# Patient Record
Sex: Male | Born: 1940 | Race: White | Hispanic: No | Marital: Married | Smoking: Never smoker
Health system: Southern US, Community
[De-identification: ages and names within clinical notes are randomized; demographics above are authoritative.]

## PROBLEM LIST (undated history)

## (undated) DIAGNOSIS — Z87442 Personal history of urinary calculi: Secondary | ICD-10-CM

## (undated) DIAGNOSIS — I2510 Atherosclerotic heart disease of native coronary artery without angina pectoris: Secondary | ICD-10-CM

## (undated) DIAGNOSIS — K5792 Diverticulitis of intestine, part unspecified, without perforation or abscess without bleeding: Secondary | ICD-10-CM

## (undated) DIAGNOSIS — I499 Cardiac arrhythmia, unspecified: Secondary | ICD-10-CM

## (undated) DIAGNOSIS — I4891 Unspecified atrial fibrillation: Secondary | ICD-10-CM

## (undated) DIAGNOSIS — E039 Hypothyroidism, unspecified: Secondary | ICD-10-CM

## (undated) DIAGNOSIS — E079 Disorder of thyroid, unspecified: Secondary | ICD-10-CM

## (undated) DIAGNOSIS — N200 Calculus of kidney: Secondary | ICD-10-CM

## (undated) DIAGNOSIS — I10 Essential (primary) hypertension: Secondary | ICD-10-CM

## (undated) DIAGNOSIS — M48061 Spinal stenosis, lumbar region without neurogenic claudication: Secondary | ICD-10-CM

## (undated) DIAGNOSIS — F32A Depression, unspecified: Secondary | ICD-10-CM

## (undated) DIAGNOSIS — F320 Major depressive disorder, single episode, mild: Secondary | ICD-10-CM

## (undated) DIAGNOSIS — D510 Vitamin B12 deficiency anemia due to intrinsic factor deficiency: Secondary | ICD-10-CM

## (undated) DIAGNOSIS — E785 Hyperlipidemia, unspecified: Secondary | ICD-10-CM

## (undated) DIAGNOSIS — M1990 Unspecified osteoarthritis, unspecified site: Secondary | ICD-10-CM

## (undated) DIAGNOSIS — K219 Gastro-esophageal reflux disease without esophagitis: Secondary | ICD-10-CM

## (undated) DIAGNOSIS — K635 Polyp of colon: Secondary | ICD-10-CM

## (undated) DIAGNOSIS — K589 Irritable bowel syndrome without diarrhea: Secondary | ICD-10-CM

## (undated) DIAGNOSIS — J189 Pneumonia, unspecified organism: Secondary | ICD-10-CM

## (undated) DIAGNOSIS — G4730 Sleep apnea, unspecified: Secondary | ICD-10-CM

## (undated) HISTORY — DX: Spinal stenosis, lumbar region without neurogenic claudication: M48.061

## (undated) HISTORY — PX: KNEE SURGERY: SHX244

## (undated) HISTORY — PX: VASECTOMY: SHX75

## (undated) HISTORY — PX: OTHER SURGICAL HISTORY: SHX169

## (undated) HISTORY — DX: Gastro-esophageal reflux disease without esophagitis: K21.9

## (undated) HISTORY — PX: HEMORRHOID SURGERY: SHX153

## (undated) HISTORY — DX: Vitamin B12 deficiency anemia due to intrinsic factor deficiency: D51.0

## (undated) HISTORY — PX: UMBILICAL HERNIA REPAIR: SHX196

## (undated) HISTORY — PX: LITHOTRIPSY: SUR834

## (undated) HISTORY — DX: Disorder of thyroid, unspecified: E07.9

## (undated) HISTORY — DX: Sleep apnea, unspecified: G47.30

## (undated) HISTORY — DX: Polyp of colon: K63.5

## (undated) HISTORY — DX: Atherosclerotic heart disease of native coronary artery without angina pectoris: I25.10

## (undated) HISTORY — PX: COLONOSCOPY: SHX174

## (undated) HISTORY — DX: Essential (primary) hypertension: I10

## (undated) HISTORY — DX: Depression, unspecified: F32.A

## (undated) HISTORY — DX: Major depressive disorder, single episode, mild: F32.0

## (undated) HISTORY — DX: Irritable bowel syndrome, unspecified: K58.9

## (undated) HISTORY — PX: CARPAL TUNNEL RELEASE: SHX101

## (undated) HISTORY — DX: Hyperlipidemia, unspecified: E78.5

---

## 1964-01-05 HISTORY — PX: HEMORRHOID SURGERY: SHX153

## 1978-01-04 HISTORY — PX: VASECTOMY: SHX75

## 1982-01-04 HISTORY — PX: CARPAL TUNNEL RELEASE: SHX101

## 1998-08-28 ENCOUNTER — Encounter: Payer: Self-pay | Admitting: Specialist

## 1998-08-28 ENCOUNTER — Ambulatory Visit (HOSPITAL_COMMUNITY): Admission: RE | Admit: 1998-08-28 | Discharge: 1998-08-28 | Payer: Self-pay | Admitting: Specialist

## 1998-11-29 ENCOUNTER — Encounter: Payer: Self-pay | Admitting: Emergency Medicine

## 1998-11-29 ENCOUNTER — Emergency Department (HOSPITAL_COMMUNITY): Admission: EM | Admit: 1998-11-29 | Discharge: 1998-11-29 | Payer: Self-pay | Admitting: Emergency Medicine

## 1998-12-01 ENCOUNTER — Observation Stay (HOSPITAL_COMMUNITY): Admission: EM | Admit: 1998-12-01 | Discharge: 1998-12-02 | Payer: Self-pay

## 1998-12-01 ENCOUNTER — Encounter: Payer: Self-pay | Admitting: *Deleted

## 1998-12-01 ENCOUNTER — Encounter: Admission: RE | Admit: 1998-12-01 | Discharge: 1998-12-01 | Payer: Self-pay | Admitting: *Deleted

## 1998-12-03 ENCOUNTER — Encounter: Payer: Self-pay | Admitting: *Deleted

## 1998-12-04 ENCOUNTER — Ambulatory Visit (HOSPITAL_COMMUNITY): Admission: RE | Admit: 1998-12-04 | Discharge: 1998-12-04 | Payer: Self-pay | Admitting: *Deleted

## 1998-12-04 ENCOUNTER — Encounter: Payer: Self-pay | Admitting: *Deleted

## 1998-12-11 ENCOUNTER — Encounter: Admission: RE | Admit: 1998-12-11 | Discharge: 1998-12-11 | Payer: Self-pay | Admitting: *Deleted

## 1998-12-11 ENCOUNTER — Encounter: Payer: Self-pay | Admitting: *Deleted

## 1998-12-25 ENCOUNTER — Encounter: Admission: RE | Admit: 1998-12-25 | Discharge: 1998-12-25 | Payer: Self-pay | Admitting: *Deleted

## 1998-12-25 ENCOUNTER — Encounter: Payer: Self-pay | Admitting: *Deleted

## 1999-01-01 ENCOUNTER — Ambulatory Visit (HOSPITAL_COMMUNITY): Admission: RE | Admit: 1999-01-01 | Discharge: 1999-01-01 | Payer: Self-pay | Admitting: *Deleted

## 1999-01-01 ENCOUNTER — Encounter: Payer: Self-pay | Admitting: *Deleted

## 1999-01-22 ENCOUNTER — Encounter: Admission: RE | Admit: 1999-01-22 | Discharge: 1999-01-22 | Payer: Self-pay | Admitting: Pediatrics

## 1999-01-22 ENCOUNTER — Encounter: Payer: Self-pay | Admitting: *Deleted

## 2003-01-05 HISTORY — PX: KNEE SURGERY: SHX244

## 2004-12-14 ENCOUNTER — Inpatient Hospital Stay (HOSPITAL_COMMUNITY): Admission: RE | Admit: 2004-12-14 | Discharge: 2004-12-15 | Payer: Self-pay | Admitting: Orthopedic Surgery

## 2007-01-05 HISTORY — PX: OTHER SURGICAL HISTORY: SHX169

## 2009-10-03 ENCOUNTER — Ambulatory Visit: Payer: Self-pay | Admitting: Cardiovascular Disease

## 2009-10-07 ENCOUNTER — Telehealth: Payer: Self-pay | Admitting: Cardiovascular Disease

## 2009-10-10 ENCOUNTER — Ambulatory Visit: Payer: Self-pay | Admitting: Cardiovascular Disease

## 2009-10-15 ENCOUNTER — Ambulatory Visit: Payer: Self-pay | Admitting: Cardiovascular Disease

## 2009-10-15 ENCOUNTER — Ambulatory Visit (HOSPITAL_COMMUNITY): Admission: RE | Admit: 2009-10-15 | Discharge: 2009-10-15 | Payer: Self-pay | Admitting: Cardiology

## 2009-10-15 ENCOUNTER — Telehealth: Payer: Self-pay | Admitting: Cardiovascular Disease

## 2009-10-15 HISTORY — PX: CARDIAC CATHETERIZATION: SHX172

## 2009-10-24 ENCOUNTER — Ambulatory Visit: Payer: Self-pay | Admitting: Cardiovascular Disease

## 2010-02-03 NOTE — Progress Notes (Signed)
  Phone Note Outgoing Call   Call placed by: Dessie Coma  LPN,  October 07, 2009 10:53 AM Call placed to: Patient Summary of Call: [facility] - notified patient per Dr. Kirke Corin, cholesterol levels looked fine.

## 2010-02-03 NOTE — Progress Notes (Signed)
  Phone Note Outgoing Call   Caller: Dr. Kirke Corin Summary of Call: T.C. from Dr. 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   Call placed by: Dessie Coma  LPN,  October 15, 2009 3:59 PM Call placed to: Patient Summary of Call: LMVM-notified patient of appt. for Echo tomorrow 10/16/09 at 10:30am at [facility].  To arrive at [facility]. at 10am.

## 2010-03-19 LAB — CBC
HCT: 38.7 % — ABNORMAL LOW (ref 39.0–52.0)
Hemoglobin: 12.9 g/dL — ABNORMAL LOW (ref 13.0–17.0)
MCH: 30.3 pg (ref 26.0–34.0)
MCHC: 33.3 g/dL (ref 30.0–36.0)
MCV: 90.8 fL (ref 78.0–100.0)
Platelets: 149 10*3/uL — ABNORMAL LOW (ref 150–400)
RBC: 4.26 MIL/uL (ref 4.22–5.81)
RDW: 12.6 % (ref 11.5–15.5)
WBC: 7.1 10*3/uL (ref 4.0–10.5)

## 2010-03-19 LAB — BASIC METABOLIC PANEL
BUN: 7 mg/dL (ref 6–23)
CO2: 22 mEq/L (ref 19–32)
Calcium: 7.5 mg/dL — ABNORMAL LOW (ref 8.4–10.5)
Chloride: 114 mEq/L — ABNORMAL HIGH (ref 96–112)
Creatinine, Ser: 0.67 mg/dL (ref 0.4–1.5)
GFR calc Af Amer: >60 mL/min (ref >60)
GFR calc non Af Amer: >60 mL/min (ref >60)
Glucose, Bld: 86 mg/dL (ref 70–99)
Potassium: 3.2 mEq/L — ABNORMAL LOW (ref 3.5–5.1)
Sodium: 139 mEq/L (ref 135–145)

## 2010-03-19 LAB — PROTIME-INR: INR: 1.04 (ref 0.00–1.49)

## 2010-03-30 ENCOUNTER — Encounter: Payer: Self-pay | Admitting: Cardiovascular Disease

## 2010-04-09 ENCOUNTER — Ambulatory Visit (INDEPENDENT_AMBULATORY_CARE_PROVIDER_SITE_OTHER): Payer: Self-pay | Admitting: Cardiovascular Disease

## 2010-04-09 ENCOUNTER — Encounter: Payer: Self-pay | Admitting: Cardiovascular Disease

## 2010-04-09 DIAGNOSIS — Z01810 Encounter for preprocedural cardiovascular examination: Secondary | ICD-10-CM

## 2010-04-09 DIAGNOSIS — E785 Hyperlipidemia, unspecified: Secondary | ICD-10-CM

## 2010-04-09 DIAGNOSIS — I2510 Atherosclerotic heart disease of native coronary artery without angina pectoris: Secondary | ICD-10-CM

## 2010-04-09 DIAGNOSIS — I10 Essential (primary) hypertension: Secondary | ICD-10-CM

## 2010-04-09 HISTORY — DX: Encounter for preprocedural cardiovascular examination: Z01.810

## 2010-04-09 NOTE — Progress Notes (Signed)
HPI  Miguel Morgan is here for his 6 months follow up visit. He also needs preoperative cardiovascular evaluation prior to back surgery. He had no cardiac events since his last visit. He feels well from a cardiac standpoint. No chest pain, dyspnea or palpitations. He is having significant back pain that is limiting his ability to perform activites of  Daily living.   No Known Allergies   Current Outpatient Prescriptions on File Prior to Visit  Medication Sig Dispense Refill  . Ascorbic Acid (VITAMIN C) 500 MG tablet Take 500 mg by mouth daily.        Marland Kitchen aspirin 325 MG tablet Take 325 mg by mouth daily.        Marland Kitchen atenolol (TENORMIN) 100 MG tablet Take 50 mg by mouth daily.       . Cholecalciferol (VITAMIN D) 2000 UNITS tablet Take 2,000 Units by mouth daily.        Marland Kitchen diltiazem (CARDIZEM CD) 360 MG 24 hr capsule Take 360 mg by mouth daily.        . fish oil-omega-3 fatty acids 1000 MG capsule Take 1 g by mouth 2 (two) times daily.        Marland Kitchen glucosamine-chondroitin 500-400 MG tablet Take 3 tablets by mouth 2 (two) times daily.        Marland Kitchen levothyroxine (SYNTHROID) 150 MCG tablet Take 150 mcg by mouth daily.        Marland Kitchen losartan (COZAAR) 50 MG tablet Take 50 mg by mouth daily.        Marland Kitchen omeprazole (PRILOSEC) 40 MG capsule Take 40 mg by mouth daily.        . rosuvastatin (CRESTOR) 5 MG tablet Take 5 mg by mouth daily.           Past Medical History  Diagnosis Date  . Sleep apnea   . Thyroid disease     hypothyroidism  . GERD (gastroesophageal reflux disease)   . Coronary artery disease     nonobstructive CAD  . Hyperlipidemia   . Hypertension      Past Surgical History  Procedure Date  . Knee surgery     right  . Hemorrhoid surgery   . Carpal tunnel release   . Cataract surgery   . Cardiac catheterization 10/15/2009    only mild CAD. 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 Normal EF.      History reviewed. No pertinent family history.   History   Social  History  . Marital Status: Married    Spouse Name: N/A    Number of Children: N/A  . Years of Education: N/A   Occupational History  . Not on file.   Social History Main Topics  . Smoking status: Never Smoker   . Smokeless tobacco: Not on file  . Alcohol Use: No  . Drug Use: No  . Sexually Active:    Other Topics Concern  . Not on file   Social History Narrative  . No narrative on file     ROS Constitutional: Negative for fever, chills, diaphoresis, activity change, appetite change and fatigue.  HENT: Negative for hearing loss, nosebleeds, congestion, sore throat, facial swelling, drooling, trouble swallowing, neck pain, voice change, sinus pressure and tinnitus.  Eyes: Negative for photophobia, pain, discharge and visual disturbance.  Respiratory: Negative for apnea, cough, chest tightness, shortness of breath and wheezing.  Cardiovascular: Negative for chest pain, palpitations and leg swelling.  Gastrointestinal: Negative for nausea, vomiting, abdominal pain,  diarrhea, constipation, blood in stool and abdominal distention.  Genitourinary: Negative for dysuria, urgency, frequency, hematuria and decreased urine volume.  Musculoskeletal: Negative for myalgias, joint swelling. Skin: Negative for color change, pallor, rash and wound.  Neurological: Negative for dizziness, tremors, seizures, syncope, speech difficulty, weakness, light-headedness, numbness and headaches.  Psychiatric/Behavioral: Negative for suicidal ideas, hallucinations, behavioral problems and agitation. The patient is not nervous/anxious.     PHYSICAL EXAM   BP 139/80  Pulse 60  Wt 234 lb 6.4 oz (106.323 kg)  SpO2 96% Constitutional: He is oriented to person, place, and time. He appears well-developed and well-nourished. No distress.  HENT: No nasal discharge.  Head: Normocephalic and atraumatic.  Eyes: Pupils are equal, round, and reactive to light. Right eye exhibits no discharge. Left eye exhibits  no discharge.  Neck: Normal range of motion. Neck supple. No JVD present. No thyromegaly present.  Cardiovascular: Normal rate, regular rhythm, normal heart sounds and intact distal pulses. Exam reveals no gallop and no friction rub.  No murmur heard.  Pulmonary/Chest: Effort normal and breath sounds normal. No stridor. No respiratory distress. He has no wheezes. He has no rales. He exhibits no tenderness.  Abdominal: Soft. Bowel sounds are normal. He exhibits no distension. There is no tenderness. There is no rebound and no guarding.  Musculoskeletal: Normal range of motion. He exhibits no edema and no tenderness.  Neurological: He is alert and oriented to person, place, and time. Coordination normal.  Skin: Skin is warm and dry. No rash noted. He is not diaphoretic. No erythema. No pallor.  Psychiatric: He has a normal mood and affect. His behavior is normal. Judgment and thought content normal.       EKG:NSR, nonspecific TW changes.    ASSESSMENT AND PLAN

## 2010-04-09 NOTE — Assessment & Plan Note (Signed)
BP is well controlled. Continue current medications. His bradycardiac improved after decreasing dose of Atenolol.

## 2010-04-09 NOTE — Assessment & Plan Note (Signed)
Continue small dose Crestor. Goal LDL <100.

## 2010-04-09 NOTE — Assessment & Plan Note (Signed)
This was mild and nonobstructive per cardiac cath in 10/2009. Continue medical therapy.

## 2010-04-09 NOTE — Assessment & Plan Note (Signed)
He is going to undergo back surgery. He is considered low risk from a cardiac standpoint. His recent cardiac catheterization showed no significant obstructive CAD. No further cardiac testing is needed at this time.  Aspirin can be stopped 1 week before surgery if needed.  Agree with holding Losartan on day of surgery. Continue Atenolol and Cardizem to prevent rebound tachycardia.

## 2010-04-13 ENCOUNTER — Encounter: Payer: Self-pay | Admitting: Cardiovascular Disease

## 2010-04-15 HISTORY — PX: LUMBAR DISC SURGERY: SHX700

## 2010-05-19 NOTE — Assessment & Plan Note (Signed)
[facility]                        [place] CARDIOLOGY OFFICE NOTE   NAME:Miguel Morgan, Miguel Morgan                   MRN:          [id number]  DATE:10/10/2009                            DOB:          [birth year]    Miguel Morgan is a 70 year old gentleman who is here today for a followup  visit.  Miguel Morgan has the following problem list:  1. History of recurrent chest pain in the past with multiple cardiac      catheterizations in the 70s and 80s.  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  Miguel Morgan was suspected of      having coronary spasm at that time.  2. Hypertension.  3. Hyperlipidemia.  4. Sleep apnea.  5. Back pain.  6. Hypothyroidism.  7. Gastroesophageal reflux disease.   I evaluated Miguel Morgan recently for symptoms of exertional dyspnea  with moderate activities without any reported chest pain.  Due to his  risk factors, I proceeded with a treadmill stress test which was done  today at [facility].  His resting electrocardiogram showed sinus  rhythm with no significant ST or T-wave changes.  Around 3 minutes, Miguel Morgan  started having horizontal ST depression in the inferior and  inferolateral leads.  The ST depression became prominent by 6 minutes,  especially in the inferior leads where Miguel Morgan had 2 mm of ST depression.  Miguel Morgan  reported no chest pain at that level except for dyspnea.  There was also  1 mm of ST elevation in aVR.  This ST depression actually persisted for  about 10 minutes into recovery.  Miguel Morgan was given oxygen and nitroglycerin.  Miguel Morgan has not had any recent stress test before this.  Miguel Morgan continues to deny  any chest pain.   MEDICATIONS:  1. Diltiazem extended release 360 mg once daily.  2. Atenolol 100 mg once daily.  3. Synthroid 150 mcg once daily.  4. Crestor 5 mg daily.  5. Losartan 50 mg once daily.  6. Omeprazole 40 mg once daily.  7. Aspirin 325  mg once daily.  8. Fish oil 1000 mg twice daily.  9. Glucosamine twice daily.  10.Chondroitin.  11.Vitamin D.   ALLERGIES:  No known drug allergies.   PHYSICAL EXAMINATION:  GENERAL:  Miguel Morgan appears to be pleasant in no acute  distress.  VITAL SIGNS:  Weight is 227.8 pounds, blood pressure is 139/83, pulse is  71, oxygen saturation is 96% on room air.  HEENT:  Normocephalic, atraumatic.  NECK:  No JVD or carotid bruits.  RESPIRATORY:  Normal respiratory effort with no use of accessory  muscles.  Auscultation reveals normal breath sounds.  CARDIOVASCULAR:  Normal S1 and S2.  Normal PMI.  There are no gallops or murmurs.  ABDOMEN:  Benign, nontender, nondistended.  EXTREMITIES:  With no clubbing, cyanosis or edema.  Radial pulses are  normal.  PSYCHIATRIC:  Miguel Morgan is alert, oriented x3 with normal mood and affect.  MUSCULOSKELETAL:  There is normal muscle strength in the upper and  lower  extremities.   IMPRESSION:  1. Exertional dyspnea, possibly angina equivalent with significantly      abnormal treadmill ECG test.  Miguel Morgan had ST depression in the inferior      leads which was 2 mm of horizontal depression as well as in lead V4-      V6 which was 1 mm.  There was also 1 mm of ST elevation in      augmented voltage unipolar right arm lead.  Due to the      significantly abnormal stress test, I recommend proceeding with      coronary angiography and possible coronary intervention.  Risks and      benefits were discussed with Miguel Morgan.  In the meantime, I will continue      with his outpatient medications including atenolol and diltiazem.      We will continue aspirin 325 mg once daily.  2. Hypertension.  His blood pressure is reasonably controlled.  Miguel Morgan did      not take atenolol and diltiazem today as Miguel Morgan had his stress test      today.  Miguel Morgan is also on losartan.  3. Hyperlipidemia.  We will continue with Crestor, but we will likely      switch to generic atorvastatin once it becomes  available.     Lorine Bears, MD  Electronically Signed    MA/MedQ  DD: 10/10/2009  DT: 10/11/2009  Job #: [id number]

## 2010-05-19 NOTE — Letter (Signed)
October 03, 2009    Tanvir A. Blenda Nicely, M.D.  [address]  [place], [place]. [address]   RE:  Miguel Morgan, Miguel Morgan  MRN:  [id number]  /  DOB:  1940-05-16   PRIMARY CARE PHYSICIAN:  Dr. Roney Marion   Dear Dr. Blenda Nicely:   Thank you for referring Mr. Killion for further cardiac evaluation.  As  you are aware, this is a pleasant 70 year old gentleman with the  following problem list:  1. History of recurrent chest pain in the past with multiple cardiac      catheterizations in the 70s and 80s.  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  He was suspected of having a      coronary spasm at that time.  2. Hypertension.  3. Hyperlipidemia.  4. Sleep apnea.  5. Back pain.  6. Hypothyroidism.  7. Gastroesophageal reflux disease.   Mr. Borcherding is here today to establish cardiovascular care.  He used to  follow-up with Dr. Aleen Campi in [place] who retired.  The patient,  overall, has been stable from a cardiac standpoint.  However, he has  been complaining of exertional dyspnea which happens with moderate  activities.  He has not had any recent chest pain.  There is no reported  dizziness, presyncope or syncope.  He has been treated for sleep apnea  recently with improvement in his blood pressure.  There has been no  previous history of myocardial infarction or stroke.  He used to take  simvastatin for his hyperlipidemia.  However, it was switched to Crestor  due to myalgia.  The patient is having some difficulty affording Crestor  and requesting something generic, if possible.   PAST MEDICAL HISTORY:  As outlined above.   MEDICATIONS:  1. Diltiazem extended release 360 mg once daily.  2. Atenolol 100 mg once daily.  3. Synthroid 150 mcg once daily.  4. Crestor 5 mg once daily.  5. Losartan 50 mg once daily.  6. Omeprazole 40 mg once daily.  7. Aspirin 325 mg once daily.  8. Vitamin C once  daily.  9. Fish oil 1000 mg twice daily.  10.Glucosamine 1500 mg twice daily.  11.Chondroitin 1200 mg twice daily.   ALLERGIES:  No known drug allergy.   SOCIAL HISTORY:  Negative for smoking, alcohol or drug use.  He walks 2  miles every day.  He is retired.  He is married and has 1 son.   PAST SURGICAL HISTORY:  Include:  Right knee replacement, hemorrhoid  surgery, carpal tunnel surgery, cataract surgery.   FAMILY HISTORY:  Negative for premature coronary artery disease.  His  mother did have a myocardial infarction, but it was late in her life.   REVIEW OF SYSTEMS:  Remarkable for mild exertional dyspnea, apnea  episodes at night.  A full review of system was performed and is  otherwise, negative.   PHYSICAL EXAMINATION:  GENERAL:  The patient is well-built and well-  kept.  He is pleasant and in no acute distress.  VITAL SIGNS:  Weight is 229.4 pounds, blood pressure is 149/87, pulse is  56, oxygen saturation is 96% on room air.  HEENT:  Normocephalic, atraumatic.  NECK:  No jugular venous distention or carotid bruits.  RESPIRATORY:  Normal respiratory effort with no use of accessory  muscles.  Auscultation reveals normal breath sounds.  CARDIOVASCULAR:  Normal point  of maximal impulse.  Normal S1, S2 with no  gallops or murmurs.  ABDOMEN:  Benign, nontender, nondistended with no  abdominal bruits.  EXTREMITIES:  With no clubbing, cyanosis or edema.  Pedal pulses are  normal.  SKIN:  Warm and dry with no rash.  MUSCULOSKELETAL:  Normal muscle strength in the upper and lower  extremities.  PSYCHIATRIC:  He is alert, oriented x3 with normal mood and affect.   Electrocardiogram was performed which showed sinus bradycardia with a  heart rate of 58.  No significant ST or T-wave changes.   IMPRESSION:  1. Mild exertional dyspnea is likely multifactorial.  His symptoms      have improved recently after treatment of his sleep apnea.  He does      have a previous history of  recurrent chest pain in the past, but      none recently.  He has not had any recent cardiac evaluation.      Thus, I recommend proceeding with a treadmill stress test for      further evaluation to rule out underlying coronary ischemia.  We      will hold atenolol and diltiazem the day of the stress test.  An      echocardiogram would be helpful as well to evaluate for left      ventricular hypertrophy, valvular abnormalities or possible      pulmonary hypertension.  I believe he will be having the      echocardiogram done at Dr. Terrilee Croak office.  2. Hypertension.  His blood pressure is mildly elevated.  However,      according to the patient, his readings have improved significantly      in the recent months.  I will not make any changes today.  If his      systolic blood pressure remains to run above 140, then we will      consider increasing losartan to 100 mg daily.  He is slightly      bradycardic today and likely due to a combination of diltiazem and      atenolol.  However, he does not seem to be symptomatic and we will      continue with both of them today.  3. Hyperlipidemia:  His lipid profile is not available for review.  We      will request a copy.  He is requesting something cheaper then      Crestor.  I suggested switching to generic atorvastatin once it      becomes available by the end of the year.  The patient will follow      up in 4 months from now, or earlier if needed.   Thank you for allowing me to participate in the care of your patient.    Sincerely,      Lorine Bears, MD  Electronically Signed    MA/MedQ  DD: 10/03/2009  DT: 10/03/2009  Job #: [id number]

## 2010-05-19 NOTE — Assessment & Plan Note (Signed)
[facility]                        [place] CARDIOLOGY OFFICE NOTE   NAME:Rentfrow, MERL BOMMARITO                   MRN:          [id number]  DATE:10/24/2009                            DOB:          10-28-1940    Mr. Klecka is a 70 year old gentleman who is here today for a followup  visit.  He has the following problem list:  1. Nonobstructive coronary artery disease.  The most recent cardiac      catheterization was done by me on October 15, 2009.  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  The ejection fraction was normal.  2. Hypertension.  3. Hyperlipidemia.  4. Sleep apnea.  5. Hypothyroidism.  6. Gastroesophageal reflux disease.   Mr. Diantonio is overall doing well.  He has not had any episodes of  chest pain.  He has chronic exertional dyspnea which is mild.  He  complains of generalized fatigue.  There has been no syncope or  presyncope.   PHYSICAL EXAMINATION:  VITAL SIGNS:  Weight is 229.4 pounds, blood  pressure is 127/80, pulse is 53, oxygen saturation is 95% on room air.  NECK:  No JVD or carotid bruits.  LUNGS:  Clear to auscultation.  HEART:  Regular rate and rhythm with no gallops or murmurs.  ABDOMEN:  Benign, nontender, nondistended.  EXTREMITIES:  With no clubbing, cyanosis, or edema.  Right radial side  is intact with no hematoma.  Pulse is normal.   IMPRESSION:  1. Nonobstructive coronary artery disease:  This was documented by      recent cardiac catheterization after a positive stress test.  At      this time, I recommend continuing aggressive medical therapy.  His      most recent lipid profile showed a total cholesterol of 152, HDL      48, triglyceride 85, and an LDL of 87.  Goal LDL should be less      than 100 which is already the case.  We will continue with Crestor      5 mg  daily.  We will also continue with daily aspirin.  2. Sinus bradycardia with generalized fatigue:  This is likely drug      induced with diltiazem and atenolol.  I will go ahead and decrease      the dose of atenolol to 50 mg once daily.  I asked him to continue      regular exercise program.  3. Hyperlipidemia.  We will continue with Crestor 5 mg daily.  The      patient did have an abnormality on left ventricular angiography      which was likely due to a prominent papillary      muscle.  An echocardiogram was done which showed no significant      abnormalities.  I will      review the study myself and we will inform the patient.  He will  follow up in 6 months from now or earlier if needed.     Lorine Bears, MD  Electronically Signed    MA/MedQ  DD: 10/24/2009  DT: 10/25/2009  Job #: [id number]

## 2010-05-22 NOTE — H&P (Signed)
NAME:  QUAMEL, FITZMAURICE NO.:  [id number]   MEDICAL RECORD NO.:  [id number]          PATIENT TYPE:  INP   LOCATION:  NA                           FACILITY:  [facility]   PHYSICIAN:  Mila Homer. Sherlean Foot, M.D. DATE OF BIRTH:  [birth year]   DATE OF ADMISSION:  DATE OF DISCHARGE:                                HISTORY & PHYSICAL   CHIEF COMPLAINT:  Right knee pain.   HISTORY OF PRESENT ILLNESS:  Mr. Ruta is a 70 year old male with right  knee pain since a basketball injury in the early 1990s while playing  basketball.  He had a knee scope in 1995, which helped the clicking, but  he still had pain in the knee.  He says the pain is now a constant pain,  made worse with ambulation.  The pain does radiate down the right leg into  the mid tibia.  He describes the pain as a dull, achy pain.  No assistive  devices.  No mechanical symptoms.  He has failed conservative treatment,  which included cortisone injections.  Patient is to undergo a right knee  hemiarthroplasty by Dr. Sherlean Foot on December 14, 2004.   X-RAYS:  The x-rays showed complete medial compartment collapse with good  preservation of the patellofemoral compartment.   ALLERGIES:  No known drug allergies.   MEDICATIONS:  1.  Atenolol 50 mg daily.  2.  Verapamil 240 mg daily.  3.  Synthroid 125 mg daily.  4.  Crestor 5 mg daily.  5.  Nexium 40 mg daily.  6.  Aspirin 325 mg once daily, stopped on December 07, 2004.  7.  Fish oil 1000 mg once daily.  8.  Glucosamine chondroitin 700/600 mg b.i.d.  9.  Folic acid 800 mg daily.  10. Citrucel 1 daily.   PAST MEDICAL HISTORY:  Positive for hypertension, hypothyroidism,  hyperlipidemia, coronary artery spasms, occasional skipped heartbeat,  history of kidney stones and GERD.   PAST SURGICAL HISTORY:  1.  Hernia repair in 1965.  2.  Hemorrhoidectomy in 1982.  3.  Right carpal tunnel syndrome in 1985 by Dr. Cleophas Dunker.  4.  Arthroscopic knee surgery, right, in 1995 by Dr.  Montez Morita.   Patient denies any complications with the above procedures.  He has had no  blood transfusions in the past.   SOCIAL HISTORY:  The patient denies any tobacco or alcohol use.  He is  married and has one grown son.  He lives in a one story home with one step  to the usual entrance.  He is self-employed and is currently working.  Primary care physician is Dr. Gayland Curry.  Phone number is [phone].   FAMILY HISTORY:  Patient's mother deceased at age 22.  Had a history of  hypertension and history of MI.  Father deceased at age 7 with  complications of stomach cancer.  Also had diabetes mellitus.  Has three  living brothers, ages 32, 7, and 41.  All have hypertension.  The 68-year-  old had bilateral knee replacements.   REVIEW OF SYSTEMS:  Patient had a cold approximately three weeks ago;  however,  this has resolved.  He currently has no cough, fever, or flu-like  symptoms.  The patient denies any chest pain, shortness of breath, PND, or  orthopnea.  He wear glasses at all times.  He has gastric reflux,  hypertension, hyperlipidemia, hypothyroidism, history of kidney stones.  Otherwise, review of systems is negative and noncontributory.  Patient does  not have a living will, nor does he have a power of attorney.   PHYSICAL EXAMINATION:  VITAL SIGNS:  Patient's height is approximately 6  foot and weighs 240 pounds.  Temp 98.7 degrees Fahrenheit, blood pressure  150/90, pulse 64, respiratory rate 16.  GENERAL:  Patient is a well-developed and well-nourished male in no acute  distress.  Patient walks with a slight limp, antalgic gait on the right.  He  uses no assistive devices.  He has slight varus deformity.  Mood and affect  appropriate.  He talks easily with the examiner.  HEENT:  Head is normocephalic and atraumatic.  No frontal or maxillary sinus  tenderness to palpation.  PERRL.  Sclerae are anicteric.  EOMs are intact.  Conjunctivae are pink and moist.  No visible  external ear deformities.  TM's  are pearly gray bilaterally.  Nose:  Nasal septum midline.  Nasal mucosa is  pink and moist without polyps.  Buccal mucosa is pink and moist.  Good  dentition.  Pharynx without erythema or exudate.  NECK:  No lymphadenopathy.  Carotids are 2+ without bruits.  Patient has  full range of motion of the cervical spine without pain.  Patient has no  tenderness to palpation up and down the cervical column.  BACK:  No tenderness with palpation over the thoracic and lumbar spine.  CHEST:  Lungs clear to auscultation bilaterally.  No wheezes, rales or  rhonchi noted.  CARDIAC:  Regular rate and rhythm.  No murmurs, rubs or gallops noted.  ABDOMEN:  Positive bowel sounds x4 quadrants.  Soft, nontender.  No  hepatosplenomegaly.  MUSCULOSKELETAL:  Upper extremities are equal and symmetric in size and  shape.  Patient has full range of motion of the shoulders, elbows, wrists,  and hands.  Does have a slight deformity of the left wrist on the radial  side from a previous fracture.  There is a well-healed scar on the volar  aspect of the right wrist from previous carpal tunnel.  Lower extremities:  Patient has full range of motion of both hips without pain.  Flexion to 90  degrees of bilateral hips without pain.  Left knee reveals 0-130 degrees of  flexion.  No effusion.  No edema.  Valgus/varus stressing reveals no laxity.  Right knee 0-120 degrees of flexion, approximately 3-5 degree varus  deformity.  No effusion.  No edema.  Varus and valgus stressing reveals no  laxity.  He has no tenderness with palpation along the medial lateral joint  line.  The lower extremities are nonedematous, and posterior tibial pulses  are 2+ bilaterally.  NEUROLOGIC:  Patient is alert and oriented x3.  Cranial nerves II-XII are  grossly intact.  Deep tendon reflexes, patella, and ankles, are 2+  bilaterally.  Lower extremity strength testing reveals 5/5 strength throughout.   BREASTS/GENITOURINARY/RECTAL:  All deferred at this time.   IMPRESSION:  1.  Right knee with bone-on-bone medial compartment.  Patellofemoral      compartment well preserved.  2.  Hypertension.  3.  Hypothyroidism.  4.  Hyperlipidemia.  5.  History of coronary artery spasm.  6.  History  of kidney stones.  7.  Gastroesophageal reflux disease.   PLAN:  The patient is to be admitted to [facility] on December 14, 2004 to undergo a right knee uniarthroplasty by Dr. Sherlean Foot.  Prior to  surgery, patient will undergo all preoperative labs and testing.  Patient  did receive preoperative clearance from his primary care physician prior to  surgery and was considered low risk.      Richardean Canal, P.A.    ______________________________  Mila Homer. Sherlean Foot, M.D.    GC/MEDQ  D:  12/10/2004  T:  12/10/2004  Job:  [id number]

## 2010-05-22 NOTE — Op Note (Signed)
NAME:  Miguel Morgan, Miguel Morgan NO.:  [id number]   MEDICAL RECORD NO.:  [id number]          PATIENT TYPE:  INP   LOCATION:  2899                         FACILITY:  [facility]   PHYSICIAN:  Mila Homer. Sherlean Foot, M.D. DATE OF BIRTH:  November 20, 1940   DATE OF PROCEDURE:  12/13/2004  DATE OF DISCHARGE:                                 OPERATIVE REPORT   SURGEON:  Mila Homer. Sherlean Foot, M.D.   ASSISTANTArlys John D. Petrarca, P.A.-C.   ANESTHESIA:  General.   PREOPERATIVE DIAGNOSIS:  Right knee medial compartment arthritis.   POSTOPERATIVE DIAGNOSIS:  Right knee medial compartment arthritis.   PROCEDURE:  Right knee unicompartmental arthroplasty.   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  Informed consent was obtained.   DESCRIPTION OF PROCEDURE:  The patient was taken to the operating room and  administered general anesthesia.  The right lower extremity was prepped and  draped in the usual sterile fashion.  After a sterile prep and drape, the  extremity was exsanguinated with the Esmarch and elevated to 350 mmHg and  was up for one hour.  I then made a median parapatellar arthrotomy extending  from the tibial tubercle up to the medial edge of the patella.  I then teed  the arthrotomy at the level of the midpole of the patella.  I tagged these  with 0 Vicryl sutures.  I then made an arthrotomy and removed the medial  portion of the anterior fat pad.  At this point I removed the meniscus.  I  then removed all osteophytes.  I then used the C-arm and the extramedullary  alignment guide and pinned our cutting block into place after finding the  center of the ankle and the center of the hip, thus identifying the  mechanical axis.  I then cut our distal femur and proximal tibia with  cutting jigs and the sagittal saw.  I then used the femoral template size F  and  pinned it into place, cutting the chamfers and posterior condyle.  I  then removed this block and these bone pieces and finished the tibia with a  size 6 tibial tray, drill and saw.  I then placed the femur trial on with an  8 insert and a good flexion-extension gap balance.  I removed the  components, copiously irrigated and then cemented in the femur first, tibia  second, and snapped in the real polyethylene, located the knee in extension  with some pressure the cement was hardening, and removed all excess cement.  I then placed bone wax and let the tourniquet down, which was at 62 minutes.  I put a Hemovac in superolaterally and deep to the arthrotomy.  I closed the  arthrotomy with figure-of-eight #1 Vicryl sutures, the deep soft tissues  with interrupted buried 0 Vicryl sutures, subcuticular 2-0 Vicryl stitch and  skin staples.  Dressed with Xeroform dressing sponges, ABDs, Webril and TED  stocking.   COMPLICATIONS:  None.   DRAINS:  One Hemovac.  ESTIMATED BLOOD LOSS:  100 mL.           ______________________________  Mila Homer. Sherlean Foot, M.D.     SDL/MEDQ  D:  12/14/2004  T:  12/14/2004  Job:  [id number]

## 2010-08-20 ENCOUNTER — Encounter: Payer: Self-pay | Admitting: Cardiovascular Disease

## 2010-08-31 ENCOUNTER — Encounter: Payer: Self-pay | Admitting: Cardiovascular Disease

## 2010-12-09 ENCOUNTER — Ambulatory Visit (INDEPENDENT_AMBULATORY_CARE_PROVIDER_SITE_OTHER): Payer: Medicare Other | Admitting: Cardiology

## 2010-12-09 ENCOUNTER — Encounter: Payer: Self-pay | Admitting: Cardiology

## 2010-12-09 VITALS — BP 130/78 | HR 60 | Ht 71.0 in | Wt 236.0 lb

## 2010-12-09 DIAGNOSIS — R1013 Epigastric pain: Secondary | ICD-10-CM

## 2010-12-09 DIAGNOSIS — I10 Essential (primary) hypertension: Secondary | ICD-10-CM

## 2010-12-09 DIAGNOSIS — E7800 Pure hypercholesterolemia, unspecified: Secondary | ICD-10-CM

## 2010-12-09 DIAGNOSIS — E785 Hyperlipidemia, unspecified: Secondary | ICD-10-CM

## 2010-12-09 DIAGNOSIS — I119 Hypertensive heart disease without heart failure: Secondary | ICD-10-CM

## 2010-12-09 DIAGNOSIS — K3189 Other diseases of stomach and duodenum: Secondary | ICD-10-CM

## 2010-12-09 DIAGNOSIS — I2510 Atherosclerotic heart disease of native coronary artery without angina pectoris: Secondary | ICD-10-CM

## 2010-12-09 DIAGNOSIS — E039 Hypothyroidism, unspecified: Secondary | ICD-10-CM

## 2010-12-09 MED ORDER — ROSUVASTATIN CALCIUM 5 MG PO TABS: 5.0000 mg | ORAL_TABLET | Freq: Every day | ORAL | Status: DC

## 2010-12-09 MED ORDER — LEVOTHYROXINE SODIUM 150 MCG PO TABS: 150.0000 ug | ORAL_TABLET | Freq: Every day | ORAL | Status: DC

## 2010-12-09 MED ORDER — LOSARTAN POTASSIUM 50 MG PO TABS: 50.0000 mg | ORAL_TABLET | Freq: Every day | ORAL | Status: DC

## 2010-12-09 MED ORDER — DILTIAZEM HCL ER COATED BEADS 360 MG PO CP24: 360.0000 mg | ORAL_CAPSULE | Freq: Every day | ORAL | Status: DC

## 2010-12-09 MED ORDER — ATENOLOL 50 MG PO TABS: 50.0000 mg | ORAL_TABLET | Freq: Every day | ORAL | Status: DC

## 2010-12-09 NOTE — Progress Notes (Signed)
Miguel Morgan Date of Birth:  1940/06/16 [facility] [address]   [address] [place], [place]  [address] [phone]           Fax   [phone]  History of Present Illness: This pleasant 70 year old gentleman from [place] is seen for the first time by me today.  He is a former patient of Dr. Kirke Corin.  Prior to that he was a patient of Dr. Charolette Child.  The patient has a history of previous chest pain.  He has had previous cardiac catheterizations showing no significant obstructive disease.  Cardiac catheterization was in October 2011.  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  He has been feeling well with no recent cardiac symptoms.  He denies any exertional chest pain or angina.  He walks regularly for exercise.  He notes only occasional palpitations.  He said no dizziness or syncope.  Is not having any paroxysmal nocturnal dyspnea or symptoms of congestive heart failure.  Current Outpatient Prescriptions  Medication Sig Dispense Refill  . Ascorbic Acid (VITAMIN C) 500 MG tablet Take 500 mg by mouth daily.        Marland Kitchen aspirin 325 MG tablet Take 325 mg by mouth daily.        Marland Kitchen atenolol (TENORMIN) 50 MG tablet Take 1 tablet (50 mg total) by mouth daily.  90 tablet  3  . Cholecalciferol (VITAMIN D) 2000 UNITS tablet Take 2,000 Units by mouth daily.        Marland Kitchen diltiazem (CARDIZEM CD) 360 MG 24 hr capsule Take 1 capsule (360 mg total) by mouth daily.  90 capsule  3  . fish oil-omega-3 fatty acids 1000 MG capsule Take 1 g by mouth 2 (two) times daily.        Marland Kitchen glucosamine-chondroitin 500-400 MG tablet Take 3 tablets by mouth 2 (two) times daily.        Marland Kitchen levothyroxine (SYNTHROID) 150 MCG tablet Take 1 tablet (150  mcg total) by mouth daily.  90 tablet  3  . losartan (COZAAR) 50 MG tablet Take 1 tablet (50 mg total) by mouth daily.  90 tablet  3  . omeprazole (PRILOSEC) 40 MG capsule Take 40 mg by mouth daily.        . psyllium (METAMUCIL) 58.6 % powder Take 1 packet by mouth 3 (three) times daily. One scoop daily       . rosuvastatin (CRESTOR) 5 MG tablet Take 1 tablet (5 mg total) by mouth daily.  90 tablet  3    Allergies  Allergen Reactions  . Oxycodone     Patient Active Problem List  Diagnoses  . Coronary artery disease  . Hyperlipidemia  . Hypertension  . Preop cardiovascular exam    History  Smoking status  . Never Smoker   Smokeless tobacco  . Not on file    History  Alcohol Use No    No family history on file.  Review of Systems: Constitutional: no fever chills diaphoresis or fatigue or change in weight.  Head and neck: no hearing loss, no epistaxis, no photophobia or visual disturbance. Respiratory: No cough, shortness of breath or wheezing. Cardiovascular: No chest pain peripheral edema, palpitations. Gastrointestinal: No abdominal distention, no abdominal pain, no  change in bowel habits hematochezia or melena. Genitourinary: No dysuria, no frequency, no urgency, no nocturia. Musculoskeletal:No arthralgias, no back pain, no gait disturbance or myalgias. Neurological: No dizziness, no headaches, no numbness, no seizures, no syncope, no weakness, no tremors. Hematologic: No lymphadenopathy, no easy bruising. Psychiatric: No confusion, no hallucinations, no sleep disturbance.    Physical Exam: Filed Vitals:   12/09/10 1355  BP: 130/78  Pulse: 60   the general appearance reveals a well-developed well-nourished gentleman in no distress.Pupils equal and reactive.   Extraocular Movements are full.  There is no scleral icterus.  The mouth and pharynx are normal.  The neck is supple.  The carotids reveal no bruits.  The jugular venous pressure is normal.  The thyroid is  not enlarged.  There is no lymphadenopathy.  The chest is clear to percussion and auscultation. There are no rales or rhonchi. Expansion of the chest is symmetrical.  The precordium is quiet.  The first heart sound is normal.  The second heart sound is physiologically split.  There is no murmur gallop rub or click.  There is no abnormal lift or heave.  The abdomen is soft and nontender. Bowel sounds are normal. The liver and spleen are not enlarged. There Are no abdominal masses. There are no bruits.  The pedal pulses are good.  There is no phlebitis or edema.  There is no cyanosis or clubbing. Strength is normal and symmetrical in all extremities.  There is no lateralizing weakness.  There are no sensory deficits.  The skin is warm and dry.  There is no rash.    Assessment / Plan:  The patient is doing well.  He will continue same medication.  Recheck in 6 months for followup office visit and EKG

## 2010-12-09 NOTE — Assessment & Plan Note (Signed)
No recent cardiac symptoms.  He has not had to take any sublingual nitroglycerin.

## 2010-12-09 NOTE — Assessment & Plan Note (Signed)
The patient has a history of hyperlipidemia.  He is on Crestor 5 mg daily.  He is trying to watch his diet.  He is having a difficult time losing weight

## 2010-12-09 NOTE — Assessment & Plan Note (Signed)
Patient's blood pressure is remaining stable on current therapy.  We gave him a new 90 day prescriptions on his medications today

## 2010-12-09 NOTE — Patient Instructions (Signed)
Your physician recommends that you continue on your current medications as directed. Please refer to the Current Medication list given to you today.  Your physician wants you to follow-up in: 6 months. You will receive a reminder letter in the mail two months in advance. If you don't receive a letter, please call our office to schedule the follow-up appointment.  

## 2011-03-02 ENCOUNTER — Other Ambulatory Visit: Payer: Self-pay | Admitting: *Deleted

## 2011-03-02 DIAGNOSIS — E7800 Pure hypercholesterolemia, unspecified: Secondary | ICD-10-CM

## 2011-03-02 MED ORDER — ROSUVASTATIN CALCIUM 5 MG PO TABS: 5.0000 mg | ORAL_TABLET | Freq: Every day | ORAL | Status: AC

## 2011-04-19 ENCOUNTER — Telehealth: Payer: Self-pay | Admitting: Cardiology

## 2011-04-19 DIAGNOSIS — E039 Hypothyroidism, unspecified: Secondary | ICD-10-CM

## 2011-04-19 MED ORDER — LEVOTHYROXINE SODIUM 150 MCG PO TABS: 150.0000 ug | ORAL_TABLET | Freq: Every day | ORAL | Status: DC

## 2011-04-19 NOTE — Telephone Encounter (Signed)
New Problem:    Patient's wife called in needing a new prescription of her husbands levothyroxine (SYNTHROID) 150 MCG tablet faxed into [facility] (phone#- [phone]) (fax#- [phone]). Please call back when the order has been filled.

## 2011-04-19 NOTE — Telephone Encounter (Signed)
Refilled medication, but patient has to find a pcp for future refills

## 2011-06-29 ENCOUNTER — Encounter: Payer: Self-pay | Admitting: Cardiology

## 2011-06-29 ENCOUNTER — Ambulatory Visit (INDEPENDENT_AMBULATORY_CARE_PROVIDER_SITE_OTHER): Payer: Medicare Other | Admitting: Cardiology

## 2011-06-29 VITALS — BP 134/72 | HR 55 | Ht 71.0 in | Wt 229.0 lb

## 2011-06-29 DIAGNOSIS — E785 Hyperlipidemia, unspecified: Secondary | ICD-10-CM

## 2011-06-29 DIAGNOSIS — E039 Hypothyroidism, unspecified: Secondary | ICD-10-CM

## 2011-06-29 DIAGNOSIS — I2510 Atherosclerotic heart disease of native coronary artery without angina pectoris: Secondary | ICD-10-CM

## 2011-06-29 HISTORY — DX: Hypothyroidism, unspecified: E03.9

## 2011-06-29 LAB — TSH: TSH: 1.88 u[IU]/mL (ref 0.35–5.50)

## 2011-06-29 NOTE — Assessment & Plan Note (Signed)
Patient has not been expressing any chest pain.  He stays physically active.  He does a lot of yard work.  He also walks 2 miles each morning for exercise.

## 2011-06-29 NOTE — Patient Instructions (Addendum)
Your physician wants you to follow-up in: 6 months.   You will receive a reminder letter in the mail two months in advance. If you don't receive a letter, please call our office to schedule the follow-up appointment.  Your physician recommends that you return for lab work in: today (TSH, Free T4)

## 2011-06-29 NOTE — Assessment & Plan Note (Signed)
The patient has a history of hyperlipidemia and is on Crestor.  He is not having any side effects from the Crestor.  Lipids are followed by his primary care physician Dr. Lucila Maine

## 2011-06-29 NOTE — Progress Notes (Signed)
Miguel Morgan Date of Birth:  09-21-1940 [facility] [address] [place], [place]  [address] [phone]  Fax   [phone]  HPI: This pleasant 71 year old gentleman is seen for a scheduled followup 6 month visit.  He has a past history of essential hypertension, hypercholesterolemia, and coronary artery disease.  He did have cardiac catheterization in October 2011 which did not show any significant obstructive disease.  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  His ejection fraction and LV function are normal at the time of his 10/15/09.  Current Outpatient Prescriptions  Medication Sig Dispense Refill  . Ascorbic Acid (VITAMIN C) 500 MG tablet Take 500 mg by mouth daily.        Marland Kitchen aspirin 325 MG tablet Take 325 mg by mouth daily.        Marland Kitchen atenolol (TENORMIN) 50 MG tablet Take 1 tablet (50 mg total) by mouth daily.  90 tablet  3  . Cholecalciferol (VITAMIN D) 2000 UNITS tablet Take 2,000 Units by mouth daily.        Marland Kitchen diltiazem (CARDIZEM CD) 360 MG 24 hr capsule Take 1 capsule (360 mg total) by mouth daily.  90 capsule  3  . fish oil-omega-3 fatty acids 1000 MG capsule Take 2 g by mouth daily.       Marland Kitchen glucosamine-chondroitin 500-400 MG tablet Take 1 tablet by mouth 2 (two) times daily.       Marland Kitchen levothyroxine (SYNTHROID) 150 MCG tablet Take 1 tablet (150 mcg total) by mouth daily.  90 tablet  0  . losartan (COZAAR) 50 MG tablet Take 1 tablet (50 mg total) by mouth daily.  90 tablet  3  . omeprazole (PRILOSEC) 40 MG capsule Take 40 mg by mouth daily.        . psyllium (METAMUCIL) 58.6 % powder Take 1 packet by mouth 3 (three) times daily. One scoop daily       . rosuvastatin (CRESTOR) 5 MG tablet Take 1 tablet (5 mg total) by mouth daily.  90 tablet  3    Allergies  Allergen Reactions  . Oxycodone     Patient Active Problem List  Diagnosis  . Coronary artery disease   . Hyperlipidemia  . Hypertension  . Preop cardiovascular exam  . Hypothyroid    History  Smoking status  . Never Smoker   Smokeless tobacco  . Not on file    History  Alcohol Use No    No family history on file.  Review of Systems: The patient denies any heat or cold intolerance.  No weight gain or weight loss.  The patient denies headaches or blurry vision.  There is no cough or sputum production.  The patient denies dizziness.  There is no hematuria or hematochezia.  The patient denies any muscle aches or arthritis.  The patient denies any rash.  The patient denies frequent falling or instability.  There is no history of depression or anxiety.  All other systems were reviewed and are negative.   Physical Exam: Filed Vitals:   06/29/11 1211  BP: 134/72  Pulse: 55   general appearance reveals a well-developed well-nourished gentleman in no distress.Pupils equal and reactive.   Extraocular Movements are full.  There is no scleral icterus.  The mouth and pharynx are normal.  The neck is supple.  The carotids reveal no bruits.  The jugular venous  pressure is normal.  The thyroid is not enlarged.  There is no lymphadenopathy.  The chest is clear to percussion and auscultation. There are no rales or rhonchi. Expansion of the chest is symmetrical.  The precordium is quiet.  The first heart sound is normal.  The second heart sound is physiologically split.  There is no murmur gallop rub or click.  There is no abnormal lift or heave.  The abdomen is soft and nontender. Bowel sounds are normal. The liver and spleen are not enlarged. There Are no abdominal masses. There are no bruits.  The pedal pulses are good.  There is no phlebitis or edema.  There is no cyanosis or clubbing. Strength is normal and symmetrical in all extremities.  There is no lateralizing weakness.  There are no sensory deficits.  EKG shows sinus bradycardia and no ischemic changes.    Assessment /  Plan: Continue same medication.  We are checking thyroid functions today.  Recheck in 6 months for followup office visit

## 2011-06-29 NOTE — Assessment & Plan Note (Signed)
The patient has been on long-term thyroid replacement hormone.  Recently his insurance company switched him from brand name Synthroid to Levothroid.  Since this which she has been more aware of occasional palpitations and wonders if the 2 medications are equivalent.  We will plan to get thyroid function studies on him today to be sure that he is still in the range

## 2011-07-06 NOTE — Addendum Note (Signed)
Addended by: Burnice Logan on: 07/06/2011 03:05 PM   Modules accepted: Orders

## 2011-07-15 ENCOUNTER — Other Ambulatory Visit: Payer: Self-pay | Admitting: *Deleted

## 2011-07-15 DIAGNOSIS — E039 Hypothyroidism, unspecified: Secondary | ICD-10-CM

## 2011-07-15 MED ORDER — LEVOTHYROXINE SODIUM 150 MCG PO TABS: 150.0000 ug | ORAL_TABLET | Freq: Every day | ORAL | Status: AC

## 2011-07-15 NOTE — Telephone Encounter (Signed)
Refilled levothyroxine.

## 2013-02-22 ENCOUNTER — Encounter: Payer: Self-pay | Admitting: Cardiology

## 2013-02-22 ENCOUNTER — Ambulatory Visit (INDEPENDENT_AMBULATORY_CARE_PROVIDER_SITE_OTHER): Payer: Medicare Other | Admitting: Cardiology

## 2013-02-22 ENCOUNTER — Encounter (INDEPENDENT_AMBULATORY_CARE_PROVIDER_SITE_OTHER): Payer: Self-pay

## 2013-02-22 VITALS — BP 166/84 | HR 51 | Ht 71.0 in | Wt 234.0 lb

## 2013-02-22 DIAGNOSIS — E785 Hyperlipidemia, unspecified: Secondary | ICD-10-CM

## 2013-02-22 DIAGNOSIS — I2510 Atherosclerotic heart disease of native coronary artery without angina pectoris: Secondary | ICD-10-CM

## 2013-02-22 DIAGNOSIS — E039 Hypothyroidism, unspecified: Secondary | ICD-10-CM

## 2013-02-22 DIAGNOSIS — I119 Hypertensive heart disease without heart failure: Secondary | ICD-10-CM

## 2013-02-22 DIAGNOSIS — I10 Essential (primary) hypertension: Secondary | ICD-10-CM

## 2013-02-22 MED ORDER — LOSARTAN POTASSIUM 100 MG PO TABS: 100.0000 mg | ORAL_TABLET | Freq: Every day | ORAL | Status: AC

## 2013-02-22 NOTE — Assessment & Plan Note (Signed)
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  He has not been experiencing any angina pectoris.  Generally he walks 2 miles every morning for exercise when the weather permits.  His weight is up 5 pounds since the last saw him.

## 2013-02-22 NOTE — Assessment & Plan Note (Signed)
Patient is clinically euthyroid on current therapy. 

## 2013-02-22 NOTE — Assessment & Plan Note (Signed)
His blood pressure today was elevated on arrival and on recheck.  He states that his blood pressures at home have been running high for the past several weeks.  He has been taking just 50 mg losartan daily and we will increase that back up to 100 mg daily for better blood pressure control.  The patient is not having any symptoms of congestive heart failure.

## 2013-02-22 NOTE — Progress Notes (Signed)
Miguel Morgan Date of Birth:  Oct 11, 1940 [address] [place], [place]  [address] [phone]  Fax   [phone]  HPI: This pleasant 73 year old gentleman is seen for a  followup one year office visit.  He has a past history of essential hypertension, hypercholesterolemia, and coronary artery disease.  He did have cardiac catheterization in October 2011 which did not show any significant obstructive disease.  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  His ejection fraction and LV function are normal at the time of his 10/15/09 catheterization.  He has a history of hypercholesterolemia and his lipids are followed by his primary care physician Dr. Ann Held.  Current Outpatient Prescriptions  Medication Sig Dispense Refill  . Ascorbic Acid (VITAMIN C) 500 MG tablet Take 500 mg by mouth daily.        Marland Kitchen aspirin 325 MG tablet Take 325 mg by mouth daily.        Marland Kitchen atenolol (TENORMIN) 50 MG tablet Take 1 tablet (50 mg total) by mouth daily.  90 tablet  3  . Cholecalciferol (VITAMIN D) 2000 UNITS tablet Take 2,000 Units by mouth daily.        Marland Kitchen diltiazem (CARDIZEM CD) 360 MG 24 hr capsule Take 1 capsule (360 mg total) by mouth daily.  90 capsule  3  . fish oil-omega-3 fatty acids 1000 MG capsule Take 2 g by mouth daily.       Marland Kitchen glucosamine-chondroitin 500-400 MG tablet Take 1 tablet by mouth 2 (two) times daily.       Marland Kitchen levothyroxine (SYNTHROID) 150 MCG tablet Take 1 tablet (150 mcg total) by mouth daily.  90 tablet  3  . losartan (COZAAR) 100 MG tablet Take 1 tablet (100 mg total) by mouth daily.  90 tablet  3  . omeprazole (PRILOSEC) 40 MG capsule Take 40 mg by mouth daily.        . psyllium (METAMUCIL) 58.6 % powder Take 1 packet by mouth 3 (three) times daily. One scoop daily       . rosuvastatin (CRESTOR) 5 MG tablet Take 1 tablet (5 mg total) by mouth daily.  90 tablet  3   No  current facility-administered medications for this visit.    Allergies  Allergen Reactions  . Oxycodone     Patient Active Problem List   Diagnosis Date Noted  . Hypothyroid 06/29/2011  . Preop cardiovascular exam 04/09/2010  . Coronary artery disease   . Hyperlipidemia   . Hypertension     History  Smoking status  . Never Smoker   Smokeless tobacco  . Not on file    History  Alcohol Use No    No family history on file.  Review of Systems: The patient denies any heat or cold intolerance.  No weight gain or weight loss.  The patient denies headaches or blurry vision.  There is no cough or sputum production.  The patient denies dizziness.  There is no hematuria or hematochezia.  The patient denies any muscle aches or arthritis.  The patient denies any rash.  The patient denies frequent falling or instability.  There is no history of depression or anxiety.  All other systems were reviewed and are negative.   Physical Exam: Filed Vitals:   02/22/13 1534  BP: 166/84  Pulse: 51   general appearance reveals a well-developed well-nourished gentleman in  no distress.Pupils equal and reactive.   Extraocular Movements are full.  There is no scleral icterus.  The mouth and pharynx are normal.  The neck is supple.  The carotids reveal no bruits.  The jugular venous pressure is normal.  The thyroid is not enlarged.  There is no lymphadenopathy.  The chest is clear to percussion and auscultation. There are no rales or rhonchi. Expansion of the chest is symmetrical.  The precordium is quiet.  The first heart sound is normal.  The second heart sound is physiologically split.  There is no murmur gallop rub or click.  There is no abnormal lift or heave.  The abdomen is soft and nontender. Bowel sounds are normal. The liver and spleen are not enlarged. There Are no abdominal masses. There are no bruits.  The pedal pulses are good.  There is no phlebitis or edema.  There is no cyanosis or  clubbing. Strength is normal and symmetrical in all extremities.  There is no lateralizing weakness.  There are no sensory deficits.  EKG shows sinus bradycardia and no ischemic changes.    Assessment / Plan: Continue same medication.  Work on weight loss.  Increase losartan to 100 mg daily because of systolic hypertension. Recheck here in one year for followup office visit and EKG.  Continue close followup with Dr. Nicki Reaper.

## 2013-02-22 NOTE — Patient Instructions (Signed)
INCREASE YOUR LOSARTAN TO 100 MG DAILY  Your physician wants you to follow-up in: [place] will receive a reminder letter in the mail two months in advance. If you don't receive a letter, please call our office to schedule the follow-up appointment.

## 2013-02-23 ENCOUNTER — Ambulatory Visit: Payer: Medicare Other | Admitting: Cardiology

## 2014-02-21 DIAGNOSIS — M47816 Spondylosis without myelopathy or radiculopathy, lumbar region: Secondary | ICD-10-CM | POA: Insufficient documentation

## 2014-02-21 HISTORY — DX: Spondylosis without myelopathy or radiculopathy, lumbar region: M47.816

## 2014-02-22 ENCOUNTER — Ambulatory Visit: Payer: Medicare Other | Admitting: Cardiology

## 2014-02-26 ENCOUNTER — Encounter: Payer: Self-pay | Admitting: Cardiology

## 2014-02-26 ENCOUNTER — Ambulatory Visit (INDEPENDENT_AMBULATORY_CARE_PROVIDER_SITE_OTHER): Payer: Medicare Other | Admitting: Cardiology

## 2014-02-26 VITALS — BP 152/70 | HR 54 | Ht 71.0 in | Wt 231.0 lb

## 2014-02-26 DIAGNOSIS — E785 Hyperlipidemia, unspecified: Secondary | ICD-10-CM

## 2014-02-26 DIAGNOSIS — I119 Hypertensive heart disease without heart failure: Secondary | ICD-10-CM

## 2014-02-26 DIAGNOSIS — I10 Essential (primary) hypertension: Secondary | ICD-10-CM

## 2014-02-26 DIAGNOSIS — I25118 Atherosclerotic heart disease of native coronary artery with other forms of angina pectoris: Secondary | ICD-10-CM

## 2014-02-26 NOTE — Progress Notes (Signed)
Cardiology Office Note   Date:  02/26/2014   ID:  Miguel Morgan, DOB 1940/12/17, MRN [id number]  PCP:  Default, Provider, MD  Cardiologist:   Darlin Coco, MD   No chief complaint on file.     History of Present Illness: Miguel Morgan is a 74 y.o. male who presents for a one-year follow-up office visit.  This pleasant 74 year old gentleman is seen for a followup one year office visit.He is a medical patient of Dr. Nicki Reaper in [place]. He has a past history of essential hypertension, hypercholesterolemia, and coronary artery disease. He did have cardiac catheterization in October 2011 which did not show any significant obstructive disease. 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 His ejection fraction and LV function are normal at the time of his 10/15/09 catheterization. He has a history of hypercholesterolemia and his lipids are followed by his primary care physician Dr. Ann Held.  Past Medical History  Diagnosis Date  . Sleep apnea   . Thyroid disease     hypothyroidism  . GERD (gastroesophageal reflux disease)   . Coronary artery disease     nonobstructive CAD  . Hyperlipidemia   . Hypertension     Past Surgical History  Procedure Laterality Date  . Knee surgery      right  . Hemorrhoid surgery    . Carpal tunnel release    . Cataract surgery    . Cardiac catheterization  10/15/2009    only mild CAD. 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 Normal EF.      Current Outpatient Prescriptions  Medication Sig Dispense Refill  . Ascorbic Acid (VITAMIN C) 500 MG tablet Take 500 mg by mouth daily.      Marland Kitchen aspirin 325 MG tablet Take 325 mg by mouth daily.      Marland Kitchen atenolol (TENORMIN) 50 MG tablet Take 1 tablet (50 mg total) by mouth daily. 90 tablet 3  . Cholecalciferol (VITAMIN D) 2000 UNITS tablet Take 2,000 Units by mouth daily.        Marland Kitchen diltiazem (CARDIZEM CD) 360 MG 24 hr capsule Take 1 capsule (360 mg total) by mouth daily. 90 capsule 3  . fish oil-omega-3 fatty acids 1000 MG capsule Take 2 g by mouth daily.     Marland Kitchen glucosamine-chondroitin 500-400 MG tablet Take 1 tablet by mouth 2 (two) times daily.     Marland Kitchen levothyroxine (SYNTHROID) 150 MCG tablet Take 1 tablet (150 mcg total) by mouth daily. 90 tablet 3  . losartan (COZAAR) 100 MG tablet Take 1 tablet (100 mg total) by mouth daily. 90 tablet 3  . omeprazole (PRILOSEC) 40 MG capsule Take 40 mg by mouth daily.      . psyllium (METAMUCIL) 58.6 % powder Take 1 packet by mouth 3 (three) times daily. One scoop daily     . rosuvastatin (CRESTOR) 5 MG tablet Take 1 tablet (5 mg total) by mouth daily. 90 tablet 3   No current facility-administered medications for this visit.    Allergies:   Oxycodone    Social History:  The patient  reports that he has never smoked. He does not have any smokeless tobacco history on file. He reports that he does not drink alcohol or use illicit drugs.   Family History:  The patient's family history is not on file.    ROS:  Please see the  history of present illness.   Otherwise, review of systems are positive for low back pain.  He had back surgery by Dr. Gloriann Loan 4 years ago.  Now the pain is recurring.  He is scheduled to have some epidural injections syndrome.   All other systems are reviewed and negative.    PHYSICAL EXAM: VS:  BP 152/70 mmHg  Pulse 54  Ht 5\' 11"  (1.803 m)  Wt 231 lb (104.781 kg)  BMI 32.23 kg/m2 , BMI Body mass index is 32.23 kg/(m^2). GEN: Well nourished, well developed, in no acute distress HEENT: normal Neck: no JVD, carotid bruits, or masses Cardiac: RRR; no murmurs, rubs, or gallops,no edema  Respiratory:  clear to auscultation bilaterally, normal work of breathing GI: soft, nontender, nondistended, + BS MS: no deformity or atrophy Skin: warm and dry, no rash Neuro:  Strength and sensation are intact Psych:  euthymic mood, full affect   EKG:  EKG is ordered today. The ekg ordered today demonstrates normal sinus rhythm with sinus bradycardia and mild first-degree AV block.  Since previous tracing of 02/22/13, first-degree A-V block is now present.   Recent Labs: No results found for requested labs within last 365 days.    Lipid Panel No results found for: CHOL, TRIG, HDL, CHOLHDL, VLDL, LDLCALC, LDLDIRECT    Wt Readings from Last 3 Encounters:  02/26/14 231 lb (104.781 kg)  02/22/13 234 lb (106.142 kg)  06/29/11 229 lb (103.874 kg)         ASSESSMENT AND PLAN:  1.  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 2.  Hypercholesterolemia 3.  Low back pain.  Prior history of back surgery 4 years ago. 4.  Hypothyroidism, on therapy  Disposition: Continue same medication.  Recheck in one year for follow-up office visit and EKG.  Presently his back pain is not allowing him to do much exercise.  Previously he used to walk 2 miles a day.  Hopefully after his back treatments he will be able to resume his walking program. Recheck here in one year for follow-up office visit and EKG   Current medicines are reviewed at length with the patient today.  The patient does not have concerns regarding medicines.  The following changes have been made:  no change    Orders Placed This Encounter  Procedures  . EKG 12-Lead     Disposition:   FU with Dr. Mare Ferrari in 1 Year for office visit and EKG   Signed, Darlin Coco, MD  02/26/2014 6:18 PM    [facility] [place], [place], [place]  [address] Phone: [phone]; Fax: [phone]

## 2014-02-26 NOTE — Patient Instructions (Signed)
Your physician recommends that you continue on your current medications as directed. Please refer to the Current Medication list given to you today.  Your physician wants you to follow-up in: 1 year ov/ekg You will receive a reminder letter in the mail two months in advance. If you don't receive a letter, please call our office to schedule the follow-up appointment.  

## 2014-03-21 ENCOUNTER — Encounter (HOSPITAL_COMMUNITY): Payer: Self-pay

## 2014-03-21 ENCOUNTER — Emergency Department (HOSPITAL_COMMUNITY)
Admission: EM | Admit: 2014-03-21 | Discharge: 2014-03-21 | Disposition: A | Discharge status: Home / Self Care | Payer: Medicare Other | Attending: Emergency Medicine | Admitting: Emergency Medicine

## 2014-03-21 DIAGNOSIS — E039 Hypothyroidism, unspecified: Secondary | ICD-10-CM | POA: Diagnosis not present

## 2014-03-21 DIAGNOSIS — N200 Calculus of kidney: Secondary | ICD-10-CM | POA: Insufficient documentation

## 2014-03-21 DIAGNOSIS — I2510 Atherosclerotic heart disease of native coronary artery without angina pectoris: Secondary | ICD-10-CM | POA: Insufficient documentation

## 2014-03-21 DIAGNOSIS — E785 Hyperlipidemia, unspecified: Secondary | ICD-10-CM | POA: Insufficient documentation

## 2014-03-21 DIAGNOSIS — Z8669 Personal history of other diseases of the nervous system and sense organs: Secondary | ICD-10-CM | POA: Diagnosis not present

## 2014-03-21 DIAGNOSIS — I10 Essential (primary) hypertension: Secondary | ICD-10-CM | POA: Diagnosis not present

## 2014-03-21 DIAGNOSIS — K219 Gastro-esophageal reflux disease without esophagitis: Secondary | ICD-10-CM | POA: Insufficient documentation

## 2014-03-21 DIAGNOSIS — Z9889 Other specified postprocedural states: Secondary | ICD-10-CM | POA: Insufficient documentation

## 2014-03-21 DIAGNOSIS — Z7982 Long term (current) use of aspirin: Secondary | ICD-10-CM | POA: Diagnosis not present

## 2014-03-21 DIAGNOSIS — Z79899 Other long term (current) drug therapy: Secondary | ICD-10-CM | POA: Diagnosis not present

## 2014-03-21 DIAGNOSIS — R1032 Left lower quadrant pain: Secondary | ICD-10-CM | POA: Diagnosis present

## 2014-03-21 LAB — URINE MICROSCOPIC-ADD ON

## 2014-03-21 LAB — URINALYSIS, ROUTINE W REFLEX MICROSCOPIC
Bilirubin Urine: NEGATIVE
GLUCOSE, UA: NEGATIVE mg/dL
KETONES UR: NEGATIVE mg/dL
Leukocytes, UA: NEGATIVE
Nitrite: NEGATIVE
Protein, ur: NEGATIVE mg/dL
Specific Gravity, Urine: 1.019 (ref 1.005–1.030)
Urobilinogen, UA: 0.2 mg/dL (ref 0.0–1.0)
pH: 5 (ref 5.0–8.0)

## 2014-03-21 LAB — COMPREHENSIVE METABOLIC PANEL
ALK PHOS: 71 U/L (ref 39–117)
ALT: 32 U/L (ref 0–53)
AST: 34 U/L (ref 0–37)
Albumin: 4.4 g/dL (ref 3.5–5.2)
Anion gap: 7 (ref 5–15)
BILIRUBIN TOTAL: 0.5 mg/dL (ref 0.3–1.2)
BUN: 25 mg/dL — AB (ref 6–23)
CO2: 20 mmol/L (ref 19–32)
CREATININE: 1.17 mg/dL (ref 0.50–1.35)
Calcium: 9.5 mg/dL (ref 8.4–10.5)
Chloride: 113 mmol/L — ABNORMAL HIGH (ref 96–112)
GFR calc Af Amer: 71 mL/min — ABNORMAL LOW (ref >90)
GFR calc non Af Amer: 61 mL/min — ABNORMAL LOW (ref >90)
GLUCOSE: 136 mg/dL — AB (ref 70–99)
POTASSIUM: 4.3 mmol/L (ref 3.5–5.1)
SODIUM: 140 mmol/L (ref 135–145)
TOTAL PROTEIN: 7.6 g/dL (ref 6.0–8.3)

## 2014-03-21 LAB — CBC WITH DIFFERENTIAL/PLATELET
Basophils Absolute: 0 10*3/uL (ref 0.0–0.1)
Basophils Relative: 0 % (ref 0–1)
Eosinophils Absolute: 0.1 10*3/uL (ref 0.0–0.7)
Eosinophils Relative: 0 % (ref 0–5)
HEMATOCRIT: 45 % (ref 39.0–52.0)
Hemoglobin: 14.8 g/dL (ref 13.0–17.0)
LYMPHS PCT: 12 % (ref 12–46)
Lymphs Abs: 2.3 10*3/uL (ref 0.7–4.0)
MCH: 29.5 pg (ref 26.0–34.0)
MCHC: 32.9 g/dL (ref 30.0–36.0)
MCV: 89.8 fL (ref 78.0–100.0)
MONOS PCT: 9 % (ref 3–12)
Monocytes Absolute: 1.8 10*3/uL — ABNORMAL HIGH (ref 0.1–1.0)
Neutro Abs: 15.8 10*3/uL — ABNORMAL HIGH (ref 1.7–7.7)
Neutrophils Relative %: 79 % — ABNORMAL HIGH (ref 43–77)
PLATELETS: 196 10*3/uL (ref 150–400)
RBC: 5.01 MIL/uL (ref 4.22–5.81)
RDW: 14 % (ref 11.5–15.5)
WBC: 20 10*3/uL — ABNORMAL HIGH (ref 4.0–10.5)

## 2014-03-21 LAB — LIPASE, BLOOD: Lipase: 25 U/L (ref 11–59)

## 2014-03-21 LAB — AMYLASE: Amylase: 65 U/L (ref 0–105)

## 2014-03-21 MED ORDER — HYDROCODONE-ACETAMINOPHEN 5-325 MG PO TABS: 1.0000 | ORAL_TABLET | Freq: Four times a day (QID) | ORAL | Status: DC | PRN

## 2014-03-21 NOTE — Discharge Instructions (Signed)

## 2014-03-21 NOTE — ED Provider Notes (Addendum)
CSN: [id number]     Arrival date & time 03/21/14  1857 History   First MD Initiated Contact with Patient 03/21/14 2121     Chief Complaint  Patient presents with  . Abdominal Pain     (Consider location/radiation/quality/duration/timing/severity/associated sxs/prior Treatment) Patient is a 74 y.o. male presenting with abdominal pain. The history is provided by the patient.  Abdominal Pain Pain location:  LLQ Pain quality: sharp, shooting and stabbing   Pain radiates to:  Groin Pain severity:  Severe Onset quality:  Sudden Duration:  8 hours Timing:  Constant Progression:  Resolved Chronicity:  Recurrent Context comment:  Started suddenly after he had been working at [employer] Relieved by:  Nothing Worsened by:  Nothing tried Ineffective treatments:  Lying down Associated symptoms: nausea and vomiting   Associated symptoms: no chest pain, no constipation, no cough, no diarrhea, no dysuria, no fever and no shortness of breath   Risk factors comment:  Prior history of multiple kidney stones the last one about one year ago requiring lithotripsy   Past Medical History  Diagnosis Date  . Sleep apnea   . Thyroid disease     hypothyroidism  . GERD (gastroesophageal reflux disease)   . Coronary artery disease     nonobstructive CAD  . Hyperlipidemia   . Hypertension    Past Surgical History  Procedure Laterality Date  . Knee surgery      right  . Hemorrhoid surgery    . Carpal tunnel release    . Cataract surgery    . Cardiac catheterization  10/15/2009    only mild CAD. 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 Normal EF.    History reviewed. No pertinent family history. History  Substance Use Topics  . Smoking status: Never Smoker   . Smokeless tobacco: Not on file  . Alcohol Use: No    Review of Systems  Constitutional: Negative for fever.  Respiratory: Negative for cough and shortness of breath.   Cardiovascular: Negative  for chest pain.  Gastrointestinal: Positive for nausea, vomiting and abdominal pain. Negative for diarrhea and constipation.  Genitourinary: Negative for dysuria.  All other systems reviewed and are negative.     Allergies  Oxycodone  Home Medications   Prior to Admission medications   Medication Sig Start Date End Date Taking? Authorizing Provider  Ascorbic Acid (VITAMIN C) 500 MG tablet Take 500 mg by mouth daily.     Yes Historical Provider, MD  aspirin 325 MG tablet Take 325 mg by mouth daily.     Yes Historical Provider, MD  atenolol (TENORMIN) 50 MG tablet Take 1 tablet (50 mg total) by mouth daily. 12/09/10  Yes Darlin Coco, MD  Cholecalciferol (VITAMIN D) 2000 UNITS tablet Take 2,000 Units by mouth daily.     Yes Historical Provider, MD  diltiazem (CARDIZEM CD) 360 MG 24 hr capsule Take 1 capsule (360 mg total) by mouth daily. 12/09/10  Yes Darlin Coco, MD  fish oil-omega-3 fatty acids 1000 MG capsule Take 2 g by mouth daily.    Yes Historical Provider, MD  glucosamine-chondroitin 500-400 MG tablet Take 1 tablet by mouth 2 (two) times daily.    Yes Historical Provider, MD  levothyroxine (SYNTHROID) 150 MCG tablet Take 1 tablet (150 mcg total) by mouth daily. 07/15/11  Yes Darlin Coco, MD  losartan (COZAAR) 100 MG tablet Take 1 tablet (100 mg total) by mouth daily. 02/22/13  Yes Darlin Coco, MD  omeprazole ([place]) 40  MG capsule Take 40 mg by mouth daily.     Yes Historical Provider, MD  psyllium (METAMUCIL) 58.6 % powder Take 1 packet by mouth 3 (three) times daily. One scoop daily    Yes Historical Provider, MD  rosuvastatin (CRESTOR) 5 MG tablet Take 1 tablet (5 mg total) by mouth daily. 03/02/11  Yes Darlin Coco, MD   BP 132/68 mmHg  Pulse 59  Temp(Src) 97.9 F (36.6 C) (Oral)  Resp 18  Ht 5' 11.5" (1.816 m)  Wt 230 lb (104.327 kg)  BMI 31.63 kg/m2  SpO2 97% Physical Exam  Constitutional: He is oriented to person, place, and time. He appears  well-developed and well-nourished. No distress.  HENT:  Head: Normocephalic and atraumatic.  Mouth/Throat: Oropharynx is clear and moist.  Eyes: Conjunctivae and EOM are normal. Pupils are equal, round, and reactive to light.  Neck: Normal range of motion. Neck supple.  Cardiovascular: Normal rate, regular rhythm and intact distal pulses.   No murmur heard. Pulmonary/Chest: Effort normal and breath sounds normal. No respiratory distress. He has no wheezes. He has no rales.  Abdominal: Soft. He exhibits no distension. There is no tenderness. There is no rebound and no guarding.  Musculoskeletal: Normal range of motion. He exhibits no edema or tenderness.  Neurological: He is alert and oriented to person, place, and time.  Skin: Skin is warm and dry. No rash noted. No erythema.  Psychiatric: He has a normal mood and affect. His behavior is normal.  Nursing note and vitals reviewed.   ED Course  Procedures (including critical care time) Labs Review Labs Reviewed  CBC WITH DIFFERENTIAL/PLATELET - Abnormal; Notable for the following:    WBC 20.0 (*)    Neutrophils Relative % 79 (*)    Neutro Abs 15.8 (*)    Monocytes Absolute 1.8 (*)    All other components within normal limits  COMPREHENSIVE METABOLIC PANEL - Abnormal; Notable for the following:    Chloride 113 (*)    Glucose, Bld 136 (*)    BUN 25 (*)    GFR calc non Af Amer 61 (*)    GFR calc Af Amer 71 (*)    All other components within normal limits  URINALYSIS, ROUTINE W REFLEX MICROSCOPIC - Abnormal; Notable for the following:    APPearance CLOUDY (*)    Hgb urine dipstick MODERATE (*)    All other components within normal limits  LIPASE, BLOOD  AMYLASE  URINE MICROSCOPIC-ADD ON    Imaging Review No results found.   EKG Interpretation None      MDM   Final diagnoses:  Kidney stone   Pt with symptoms consistent with kidney stone.  Denies infectious sx, or GI symptoms.  Low concern for diverticulitis or history  suggestive of AAA.  No hx suggestive of GU source (discharge) and otherwise pt is healthy.  Will hydrate, treat pain and ensure no infection with UA, CBC, CMP, lipase.    On evaluation with the pt states pain has resolved now after the last time he vomited.  No pain at this time.  Pt states hx of stones in the past with lithotripsy and pain felt similar but in a different spot than usual.  Labs show white count 20,000 which at this point feels that phase reactants. CMP within normal limits and UA positive for blood only. Patient is well-appearing has normal vital signs. We'll discharge patient home to follow-up with Dr. Jeffie Pollock or return if symptoms worsen.   [employer],  MD 03/21/14 0277  Blanchie Dessert, MD 03/21/14 2155

## 2014-03-21 NOTE — ED Notes (Signed)
Pt complains of left lower abd pain and trouble urinating since earlier today, hx of kidney stones but states the pain doesn't fell the same

## 2015-01-07 DIAGNOSIS — J189 Pneumonia, unspecified organism: Secondary | ICD-10-CM | POA: Diagnosis not present

## 2015-01-07 DIAGNOSIS — J0111 Acute recurrent frontal sinusitis: Secondary | ICD-10-CM | POA: Diagnosis not present

## 2015-01-29 DIAGNOSIS — G4733 Obstructive sleep apnea (adult) (pediatric): Secondary | ICD-10-CM | POA: Diagnosis not present

## 2015-02-06 DIAGNOSIS — I10 Essential (primary) hypertension: Secondary | ICD-10-CM | POA: Diagnosis not present

## 2015-02-06 DIAGNOSIS — E782 Mixed hyperlipidemia: Secondary | ICD-10-CM | POA: Diagnosis not present

## 2015-02-06 DIAGNOSIS — R972 Elevated prostate specific antigen [PSA]: Secondary | ICD-10-CM | POA: Diagnosis not present

## 2015-02-06 DIAGNOSIS — Z79899 Other long term (current) drug therapy: Secondary | ICD-10-CM | POA: Diagnosis not present

## 2015-02-06 DIAGNOSIS — R9431 Abnormal electrocardiogram [ECG] [EKG]: Secondary | ICD-10-CM | POA: Diagnosis not present

## 2015-02-06 DIAGNOSIS — Z0000 Encounter for general adult medical examination without abnormal findings: Secondary | ICD-10-CM | POA: Diagnosis not present

## 2015-02-06 DIAGNOSIS — Z1389 Encounter for screening for other disorder: Secondary | ICD-10-CM | POA: Diagnosis not present

## 2015-02-06 DIAGNOSIS — D509 Iron deficiency anemia, unspecified: Secondary | ICD-10-CM | POA: Diagnosis not present

## 2015-02-06 DIAGNOSIS — E039 Hypothyroidism, unspecified: Secondary | ICD-10-CM | POA: Diagnosis not present

## 2015-02-06 DIAGNOSIS — Z9181 History of falling: Secondary | ICD-10-CM | POA: Diagnosis not present

## 2015-02-06 DIAGNOSIS — K219 Gastro-esophageal reflux disease without esophagitis: Secondary | ICD-10-CM | POA: Diagnosis not present

## 2015-02-06 DIAGNOSIS — M4806 Spinal stenosis, lumbar region: Secondary | ICD-10-CM | POA: Diagnosis not present

## 2015-02-07 DIAGNOSIS — D510 Vitamin B12 deficiency anemia due to intrinsic factor deficiency: Secondary | ICD-10-CM | POA: Diagnosis not present

## 2015-02-14 DIAGNOSIS — D510 Vitamin B12 deficiency anemia due to intrinsic factor deficiency: Secondary | ICD-10-CM | POA: Diagnosis not present

## 2015-02-17 DIAGNOSIS — J209 Acute bronchitis, unspecified: Secondary | ICD-10-CM | POA: Diagnosis not present

## 2015-02-21 DIAGNOSIS — E538 Deficiency of other specified B group vitamins: Secondary | ICD-10-CM | POA: Diagnosis not present

## 2015-02-27 ENCOUNTER — Ambulatory Visit (INDEPENDENT_AMBULATORY_CARE_PROVIDER_SITE_OTHER): Payer: PPO | Admitting: Cardiology

## 2015-02-27 ENCOUNTER — Encounter: Payer: Self-pay | Admitting: Cardiology

## 2015-02-27 VITALS — BP 150/70 | HR 56 | Ht 71.5 in | Wt 232.1 lb

## 2015-02-27 DIAGNOSIS — E785 Hyperlipidemia, unspecified: Secondary | ICD-10-CM

## 2015-02-27 DIAGNOSIS — I10 Essential (primary) hypertension: Secondary | ICD-10-CM | POA: Diagnosis not present

## 2015-02-27 DIAGNOSIS — I25118 Atherosclerotic heart disease of native coronary artery with other forms of angina pectoris: Secondary | ICD-10-CM | POA: Diagnosis not present

## 2015-02-27 NOTE — Patient Instructions (Signed)
Medication Instructions:  Your physician recommends that you continue on your current medications as directed. Please refer to the Current Medication list given to you today.  Labwork: none  Testing/Procedures: none  Follow-Up: Your physician wants you to follow-up in: 1 year ov with Dr Fletcher Anon at [facility] or [facility] will receive a reminder letter in the mail two months in advance. If you don't receive a letter, please call our office to schedule the follow-up appointment.  If you need a refill on your cardiac medications before your next appointment, please call your pharmacy.

## 2015-02-27 NOTE — Progress Notes (Signed)
Cardiology Office Note   Date:  02/27/2015   ID:  Miguel Morgan, DOB 1940-04-09, MRN [id number]  PCP:  Default, Provider, MD  Cardiologist: Darlin Coco MD  Chief Complaint  Patient presents with  . scheduled follow up      History of Present Illness: Miguel Morgan is a 75 y.o. male who presents for scheduled follow-up one year visit  This pleasant 75 year old gentleman is seen for a followup one year office visit.He is a medical patient of Dr. Nicki Reaper in [place]. He has a past history of essential hypertension, hypercholesterolemia, and coronary artery disease. He did have cardiac catheterization in October 2011 which did not show any significant obstructive disease. 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 His ejection fraction and LV function are normal at the time of his 10/15/09 catheterization. He has a history of hypercholesterolemia and his lipids are followed by his primary care physician Dr. Ann Held. He reports having a lot of bronchitis and upper respiratory issues this winter.  He is just getting over another chest cold.  He has been using some albuterol inhalers but notices that it makes his heart beat fast. His blood pressure here in the office today was elevated but at home his blood pressure runs 134/78 range.  Past Medical History  Diagnosis Date  . Sleep apnea   . Thyroid disease     hypothyroidism  . GERD (gastroesophageal reflux disease)   . Coronary artery disease     nonobstructive CAD  . Hyperlipidemia   . Hypertension     Past Surgical History  Procedure Laterality Date  . Knee surgery      right  . Hemorrhoid surgery    . Carpal tunnel release    . Cataract surgery    . Cardiac catheterization  10/15/2009    only mild CAD. 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 Normal EF.      Current Outpatient  Prescriptions  Medication Sig Dispense Refill  . Ascorbic Acid (VITAMIN C) 500 MG tablet Take 500 mg by mouth daily.      Marland Kitchen aspirin 325 MG tablet Take 325 mg by mouth daily.      Marland Kitchen atenolol (TENORMIN) 50 MG tablet Take 1 tablet (50 mg total) by mouth daily. 90 tablet 3  . Cholecalciferol (VITAMIN D) 2000 UNITS tablet Take 2,000 Units by mouth daily.      Marland Kitchen diltiazem (CARDIZEM CD) 360 MG 24 hr capsule Take 1 capsule (360 mg total) by mouth daily. 90 capsule 3  . fish oil-omega-3 fatty acids 1000 MG capsule Take 2 g by mouth daily.     Marland Kitchen levothyroxine (SYNTHROID) 150 MCG tablet Take 1 tablet (150 mcg total) by mouth daily. 90 tablet 3  . losartan (COZAAR) 100 MG tablet Take 1 tablet (100 mg total) by mouth daily. 90 tablet 3  . omeprazole (PRILOSEC) 40 MG capsule Take 40 mg by mouth daily.      . psyllium (METAMUCIL) 58.6 % powder Take 1 packet by mouth 3 (three) times daily. One scoop daily     . rosuvastatin (CRESTOR) 5 MG tablet Take 1 tablet (5 mg total) by mouth daily. 90 tablet 3   No current facility-administered medications for this visit.    Allergies:   Oxycodone    Social History:  The patient  reports that he has never smoked.  He does not have any smokeless tobacco history on file. He reports that he does not drink alcohol or use illicit drugs.   Family History:  The patient's family history is not on file.    ROS:  Please see the history of present illness.   Otherwise, review of systems are positive for none.   All other systems are reviewed and negative.    PHYSICAL EXAM: VS:  BP 150/70 mmHg  Pulse 56  Ht 5' 11.5" (1.816 m)  Wt 232 lb 1.9 oz (105.289 kg)  BMI 31.93 kg/m2 , BMI Body mass index is 31.93 kg/(m^2). GEN: Well nourished, well developed, in no acute distress HEENT: normal Neck: no JVD, carotid bruits, or masses Cardiac: RRR; no murmurs, rubs, or gallops,no edema  Respiratory:  clear to auscultation bilaterally, normal work of breathing GI: soft,  nontender, nondistended, + BS MS: no deformity or atrophy Skin: warm and dry, no rash Neuro:  Strength and sensation are intact Psych: euthymic mood, full affect   EKG:  EKG is ordered today. The ekg ordered today demonstrates sinus bradycardia at 56 bpm.  Otherwise within normal limits.  Since last tracing 02/26/14, no significant change   Recent Labs: 03/21/2014: ALT 32; BUN 25*; Creatinine, Ser 1.17; Hemoglobin 14.8; Platelets 196; Potassium 4.3; Sodium 140    Lipid Panel No results found for: CHOL, TRIG, HDL, CHOLHDL, VLDL, LDLCALC, LDLDIRECT    Wt Readings from Last 3 Encounters:  02/27/15 232 lb 1.9 oz (105.289 kg)  03/21/14 230 lb (104.327 kg)  02/26/14 231 lb (104.781 kg)        ASSESSMENT AND PLAN:  1. 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 2. Hypercholesterolemia 3. Low back pain. Prior history of back surgery 4 years ago.  Difficulty losing weight and difficulty walking because of ongoing low back pain. 4. Hypothyroidism, on therapy  Disposition: Continue same medication. Recheck in one year for follow-up office visit and EKG. Presently his back pain is not allowing him to do much exercise. Previously he used to walk 2 miles a day. Hopefully after his back treatments he will be able to resume his walking program. Recheck here in one year for follow-up office visit and EKG   Current medicines are reviewed at length with the patient today.  The patient does not have concerns regarding medicines.  The following changes have been made:  no change  Labs/ tests ordered today include:   Orders Placed This Encounter  Procedures  . EKG 12-Lead     Disposition:   Following my retirement the patient will follow-up in one year with Dr. Fletcher Anon at one of the [facility]. Berna Spare MD 02/27/2015 5:44 PM    [facility] [place],  [place], [place]  [address] Phone: [phone]; Fax: [phone]

## 2015-03-03 DIAGNOSIS — E538 Deficiency of other specified B group vitamins: Secondary | ICD-10-CM | POA: Diagnosis not present

## 2015-03-17 DIAGNOSIS — J411 Mucopurulent chronic bronchitis: Secondary | ICD-10-CM | POA: Diagnosis not present

## 2015-03-28 DIAGNOSIS — D510 Vitamin B12 deficiency anemia due to intrinsic factor deficiency: Secondary | ICD-10-CM | POA: Diagnosis not present

## 2015-04-15 DIAGNOSIS — M25562 Pain in left knee: Secondary | ICD-10-CM | POA: Diagnosis not present

## 2015-04-15 DIAGNOSIS — M25462 Effusion, left knee: Secondary | ICD-10-CM | POA: Diagnosis not present

## 2015-04-22 DIAGNOSIS — M4726 Other spondylosis with radiculopathy, lumbar region: Secondary | ICD-10-CM | POA: Diagnosis not present

## 2015-04-22 DIAGNOSIS — M4806 Spinal stenosis, lumbar region: Secondary | ICD-10-CM | POA: Diagnosis not present

## 2015-04-25 DIAGNOSIS — D510 Vitamin B12 deficiency anemia due to intrinsic factor deficiency: Secondary | ICD-10-CM | POA: Diagnosis not present

## 2015-05-04 ENCOUNTER — Other Ambulatory Visit: Payer: Self-pay | Admitting: Orthopedic Surgery

## 2015-05-07 DIAGNOSIS — K5732 Diverticulitis of large intestine without perforation or abscess without bleeding: Secondary | ICD-10-CM | POA: Diagnosis not present

## 2015-05-07 DIAGNOSIS — K5730 Diverticulosis of large intestine without perforation or abscess without bleeding: Secondary | ICD-10-CM | POA: Diagnosis not present

## 2015-05-07 DIAGNOSIS — R1032 Left lower quadrant pain: Secondary | ICD-10-CM | POA: Diagnosis not present

## 2015-05-20 DIAGNOSIS — Z6831 Body mass index (BMI) 31.0-31.9, adult: Secondary | ICD-10-CM | POA: Diagnosis not present

## 2015-05-20 DIAGNOSIS — M4726 Other spondylosis with radiculopathy, lumbar region: Secondary | ICD-10-CM | POA: Diagnosis not present

## 2015-05-20 DIAGNOSIS — M5416 Radiculopathy, lumbar region: Secondary | ICD-10-CM | POA: Diagnosis not present

## 2015-05-20 DIAGNOSIS — M4806 Spinal stenosis, lumbar region: Secondary | ICD-10-CM | POA: Diagnosis not present

## 2015-05-22 DIAGNOSIS — D510 Vitamin B12 deficiency anemia due to intrinsic factor deficiency: Secondary | ICD-10-CM | POA: Diagnosis not present

## 2015-05-26 DIAGNOSIS — K5732 Diverticulitis of large intestine without perforation or abscess without bleeding: Secondary | ICD-10-CM | POA: Diagnosis not present

## 2015-05-26 DIAGNOSIS — R1032 Left lower quadrant pain: Secondary | ICD-10-CM | POA: Diagnosis not present

## 2015-05-26 DIAGNOSIS — K5730 Diverticulosis of large intestine without perforation or abscess without bleeding: Secondary | ICD-10-CM | POA: Diagnosis not present

## 2015-05-28 ENCOUNTER — Encounter (HOSPITAL_COMMUNITY)
Admission: RE | Admit: 2015-05-28 | Discharge: 2015-05-28 | Disposition: A | Discharge status: Home / Self Care | Payer: PPO | Source: Ambulatory Visit | Attending: Orthopedic Surgery | Admitting: Orthopedic Surgery

## 2015-05-28 ENCOUNTER — Ambulatory Visit (HOSPITAL_COMMUNITY)
Admission: RE | Admit: 2015-05-28 | Discharge: 2015-05-28 | Disposition: A | Discharge status: Home / Self Care | Payer: PPO | Source: Ambulatory Visit | Attending: Orthopedic Surgery | Admitting: Orthopedic Surgery

## 2015-05-28 ENCOUNTER — Encounter (HOSPITAL_COMMUNITY): Payer: Self-pay

## 2015-05-28 DIAGNOSIS — R918 Other nonspecific abnormal finding of lung field: Secondary | ICD-10-CM | POA: Diagnosis not present

## 2015-05-28 DIAGNOSIS — Z01812 Encounter for preprocedural laboratory examination: Secondary | ICD-10-CM | POA: Diagnosis not present

## 2015-05-28 DIAGNOSIS — J9811 Atelectasis: Secondary | ICD-10-CM | POA: Diagnosis not present

## 2015-05-28 DIAGNOSIS — Z01810 Encounter for preprocedural cardiovascular examination: Secondary | ICD-10-CM | POA: Insufficient documentation

## 2015-05-28 DIAGNOSIS — Z01818 Encounter for other preprocedural examination: Secondary | ICD-10-CM

## 2015-05-28 HISTORY — DX: Unspecified osteoarthritis, unspecified site: M19.90

## 2015-05-28 HISTORY — DX: Pneumonia, unspecified organism: J18.9

## 2015-05-28 HISTORY — DX: Hypothyroidism, unspecified: E03.9

## 2015-05-28 HISTORY — DX: Calculus of kidney: N20.0

## 2015-05-28 HISTORY — DX: Diverticulitis of intestine, part unspecified, without perforation or abscess without bleeding: K57.92

## 2015-05-28 LAB — COMPREHENSIVE METABOLIC PANEL
ALBUMIN: 4 g/dL (ref 3.5–5.0)
ALK PHOS: 65 U/L (ref 38–126)
ALT: 20 U/L (ref 17–63)
ANION GAP: 7 (ref 5–15)
AST: 21 U/L (ref 15–41)
BUN: 8 mg/dL (ref 6–20)
CALCIUM: 9.9 mg/dL (ref 8.9–10.3)
CO2: 24 mmol/L (ref 22–32)
Chloride: 107 mmol/L (ref 101–111)
Creatinine, Ser: 0.89 mg/dL (ref 0.61–1.24)
GFR calc non Af Amer: >60 mL/min (ref >60)
GLUCOSE: 111 mg/dL — AB (ref 65–99)
POTASSIUM: 4 mmol/L (ref 3.5–5.1)
SODIUM: 138 mmol/L (ref 135–145)
Total Bilirubin: 0.6 mg/dL (ref 0.3–1.2)
Total Protein: 6.9 g/dL (ref 6.5–8.1)

## 2015-05-28 LAB — CBC WITH DIFFERENTIAL/PLATELET
BASOS PCT: 0 %
Basophils Absolute: 0 10*3/uL (ref 0.0–0.1)
EOS ABS: 0.1 10*3/uL (ref 0.0–0.7)
EOS PCT: 1 %
HCT: 43.9 % (ref 39.0–52.0)
HEMOGLOBIN: 14 g/dL (ref 13.0–17.0)
LYMPHS ABS: 2.1 10*3/uL (ref 0.7–4.0)
Lymphocytes Relative: 26 %
MCH: 28.7 pg (ref 26.0–34.0)
MCHC: 31.9 g/dL (ref 30.0–36.0)
MCV: 90 fL (ref 78.0–100.0)
MONO ABS: 1 10*3/uL (ref 0.1–1.0)
MONOS PCT: 12 %
NEUTROS PCT: 61 %
Neutro Abs: 5.1 10*3/uL (ref 1.7–7.7)
PLATELETS: 172 10*3/uL (ref 150–400)
RBC: 4.88 MIL/uL (ref 4.22–5.81)
RDW: 14 % (ref 11.5–15.5)
WBC: 8.3 10*3/uL (ref 4.0–10.5)

## 2015-05-28 LAB — URINALYSIS, ROUTINE W REFLEX MICROSCOPIC
BILIRUBIN URINE: NEGATIVE
Glucose, UA: NEGATIVE mg/dL
Ketones, ur: NEGATIVE mg/dL
Leukocytes, UA: NEGATIVE
NITRITE: NEGATIVE
PROTEIN: NEGATIVE mg/dL
Specific Gravity, Urine: 1.011 (ref 1.005–1.030)
pH: 5.5 (ref 5.0–8.0)

## 2015-05-28 LAB — PROTIME-INR
INR: 1 (ref 0.00–1.49)
Prothrombin Time: 13.4 seconds (ref 11.6–15.2)

## 2015-05-28 LAB — URINE MICROSCOPIC-ADD ON: Bacteria, UA: NONE SEEN

## 2015-05-28 LAB — SURGICAL PCR SCREEN
MRSA, PCR: NEGATIVE
STAPHYLOCOCCUS AUREUS: NEGATIVE

## 2015-05-28 LAB — APTT: aPTT: 28 seconds (ref 24–37)

## 2015-05-28 NOTE — Progress Notes (Signed)
Pt is followed by Dr. Mare Ferrari for non-obstructive CAD. Last office visit was 02/27/15. Pt denies any recent chest pain or sob.  Echo - 10/16/09 - in [facility] Cath - 2011 - in [place] - 2004 - in [facility] EKG - 02/27/15 - in [facility]

## 2015-05-28 NOTE — Pre-Procedure Instructions (Signed)
Miguel Morgan  05/28/2015      Your procedure is scheduled on Monday, June 09, 2015 at 8:25 AM.   Report to [facility] Entrance "A" Admitting Office  at 6:30 AM.   Call this number if you have problems the morning of surgery: [phone]   Any questions prior to day of surgery, please call [phone] between 8 & 4 PM.   Remember:  Do not eat food or drink liquids after midnight Sunday, 06/08/15.  Take these medicines the morning of surgery with A SIP OF WATER: Atenolol (Tenormin), Ditiazem (Cardizem), Levothyroxine (Synthroid), Omeprazole (Prilosec)  Stop Aspirin, NSAIDS (Ibuprofen, Aleve, etc.) and Fish Oil 7 days prior to surgery.   Do not wear jewelry.  Do not wear lotions, powders, or cologne.  You may wear deodorant.  Men may shave face and neck.  Do not bring valuables to the hospital.  [facility] is not responsible for any belongings or valuables.  Contacts, dentures or bridgework may not be worn into surgery.  Leave your suitcase in the car.  After surgery it may be brought to your room.  For patients admitted to the hospital, discharge time will be determined by your treatment team.  Special instructions:  [place] - Preparing for Surgery  Before surgery, you can play an important role.  Because skin is not sterile, your skin needs to be as free of germs as possible.  You can reduce the number of germs on you skin by washing with CHG (chlorahexidine gluconate) soap before surgery.  CHG is an antiseptic cleaner which kills germs and bonds with the skin to continue killing germs even after washing.  Please DO NOT use if you have an allergy to CHG or antibacterial soaps.  If your skin becomes reddened/irritated stop using the CHG and inform your nurse when you arrive at Short Stay.  Do not shave (including legs and underarms) for at least 48 hours prior to the first CHG shower.  You may shave your face.  Please follow these instructions  carefully:   1.  Shower with CHG Soap the night before surgery and the                                morning of Surgery.  2.  If you choose to wash your hair, wash your hair first as usual with your       normal shampoo.  3.  After you shampoo, rinse your hair and body thoroughly to remove the                      Shampoo.  4.  Use CHG as you would any other liquid soap.  You can apply chg directly       to the skin and wash gently with scrungie or a clean washcloth.  5.  Apply the CHG Soap to your body ONLY FROM THE NECK DOWN.        Do not use on open wounds or open sores.  Avoid contact with your eyes, ears, mouth and genitals (private parts).  Wash genitals (private parts) with your normal soap.  6.  Wash thoroughly, paying special attention to the area where your surgery        will be performed.  7.  Thoroughly rinse your body with warm water from the neck down.  8.  DO NOT shower/wash with your normal  soap after using and rinsing off       the CHG Soap.  9.  Pat yourself dry with a clean towel.            10.  Wear clean pajamas.            11.  Place clean sheets on your bed the night of your first shower and do not        sleep with pets.  Day of Surgery  Do not apply any lotions the morning of surgery.  Please wear clean clothes to the hospital.   Please read over the following fact sheets that you were given. Pain Booklet, Coughing and Deep Breathing, MRSA Information and Surgical Site Infection Prevention

## 2015-05-29 LAB — URINE CULTURE: CULTURE: NO GROWTH

## 2015-06-04 DIAGNOSIS — M1712 Unilateral primary osteoarthritis, left knee: Secondary | ICD-10-CM

## 2015-06-04 HISTORY — DX: Unilateral primary osteoarthritis, left knee: M17.12

## 2015-06-06 MED ORDER — ACETAMINOPHEN 500 MG PO TABS
1000.0000 mg | ORAL_TABLET | Freq: Once | ORAL | Status: AC
  Administered 2015-06-09: 1000 mg via ORAL
  Filled 2015-06-06: qty 2

## 2015-06-06 MED ORDER — CEFAZOLIN SODIUM-DEXTROSE 2-4 GM/100ML-% IV SOLN
2.0000 g | INTRAVENOUS | Status: AC
  Administered 2015-06-09: 2 g via INTRAVENOUS
  Filled 2015-06-06: qty 100

## 2015-06-06 MED ORDER — TRANEXAMIC ACID 1000 MG/10ML IV SOLN
1000.0000 mg | INTRAVENOUS | Status: AC
  Administered 2015-06-09: 1000 mg via INTRAVENOUS
  Filled 2015-06-06: qty 10

## 2015-06-09 ENCOUNTER — Inpatient Hospital Stay (HOSPITAL_COMMUNITY): Payer: PPO | Admitting: Certified Registered Nurse Anesthetist

## 2015-06-09 ENCOUNTER — Encounter (HOSPITAL_COMMUNITY)
Admission: RE | Disposition: A | Discharge status: Home Health | Payer: Self-pay | Source: Ambulatory Visit | Attending: Orthopedic Surgery

## 2015-06-09 ENCOUNTER — Inpatient Hospital Stay (HOSPITAL_COMMUNITY)
Admission: RE | Admit: 2015-06-09 | Discharge: 2015-06-10 | DRG: 470 | Disposition: A | Discharge status: Home Health | Payer: PPO | Source: Ambulatory Visit | Attending: Orthopedic Surgery | Admitting: Orthopedic Surgery

## 2015-06-09 ENCOUNTER — Encounter (HOSPITAL_COMMUNITY): Payer: Self-pay | Admitting: *Deleted

## 2015-06-09 DIAGNOSIS — M179 Osteoarthritis of knee, unspecified: Secondary | ICD-10-CM | POA: Diagnosis not present

## 2015-06-09 DIAGNOSIS — Z79899 Other long term (current) drug therapy: Secondary | ICD-10-CM | POA: Diagnosis not present

## 2015-06-09 DIAGNOSIS — Z96659 Presence of unspecified artificial knee joint: Secondary | ICD-10-CM

## 2015-06-09 DIAGNOSIS — E785 Hyperlipidemia, unspecified: Secondary | ICD-10-CM | POA: Diagnosis not present

## 2015-06-09 DIAGNOSIS — Z96651 Presence of right artificial knee joint: Secondary | ICD-10-CM | POA: Diagnosis not present

## 2015-06-09 DIAGNOSIS — E039 Hypothyroidism, unspecified: Secondary | ICD-10-CM | POA: Diagnosis not present

## 2015-06-09 DIAGNOSIS — I2510 Atherosclerotic heart disease of native coronary artery without angina pectoris: Secondary | ICD-10-CM | POA: Diagnosis not present

## 2015-06-09 DIAGNOSIS — K219 Gastro-esophageal reflux disease without esophagitis: Secondary | ICD-10-CM | POA: Diagnosis present

## 2015-06-09 DIAGNOSIS — Z7982 Long term (current) use of aspirin: Secondary | ICD-10-CM | POA: Diagnosis not present

## 2015-06-09 DIAGNOSIS — G4730 Sleep apnea, unspecified: Secondary | ICD-10-CM | POA: Diagnosis present

## 2015-06-09 DIAGNOSIS — I10 Essential (primary) hypertension: Secondary | ICD-10-CM | POA: Diagnosis present

## 2015-06-09 DIAGNOSIS — M1712 Unilateral primary osteoarthritis, left knee: Principal | ICD-10-CM | POA: Diagnosis present

## 2015-06-09 DIAGNOSIS — M25562 Pain in left knee: Secondary | ICD-10-CM | POA: Diagnosis not present

## 2015-06-09 HISTORY — DX: Presence of unspecified artificial knee joint: Z96.659

## 2015-06-09 HISTORY — PX: TOTAL KNEE ARTHROPLASTY: SHX125

## 2015-06-09 LAB — CBC
HEMATOCRIT: 39.5 % (ref 39.0–52.0)
HEMOGLOBIN: 12.6 g/dL — AB (ref 13.0–17.0)
MCH: 28.1 pg (ref 26.0–34.0)
MCHC: 31.9 g/dL (ref 30.0–36.0)
MCV: 88 fL (ref 78.0–100.0)
Platelets: 155 10*3/uL (ref 150–400)
RBC: 4.49 MIL/uL (ref 4.22–5.81)
RDW: 14.1 % (ref 11.5–15.5)
WBC: 12.7 10*3/uL — ABNORMAL HIGH (ref 4.0–10.5)

## 2015-06-09 LAB — CREATININE, SERUM
CREATININE: 0.87 mg/dL (ref 0.61–1.24)
GFR calc Af Amer: >60 mL/min (ref >60)

## 2015-06-09 SURGERY — ARTHROPLASTY, KNEE, TOTAL
Anesthesia: Spinal | Laterality: Left

## 2015-06-09 MED ORDER — DOCUSATE SODIUM 100 MG PO CAPS
100.0000 mg | ORAL_CAPSULE | Freq: Two times a day (BID) | ORAL | Status: DC
  Administered 2015-06-09 – 2015-06-10 (×3): 100 mg via ORAL
  Filled 2015-06-09 (×3): qty 1

## 2015-06-09 MED ORDER — TRANEXAMIC ACID 1000 MG/10ML IV SOLN
1000.0000 mg | Freq: Once | INTRAVENOUS | Status: AC
  Administered 2015-06-09: 1000 mg via INTRAVENOUS
  Filled 2015-06-09: qty 10

## 2015-06-09 MED ORDER — HYDROMORPHONE HCL 1 MG/ML IJ SOLN
INTRAMUSCULAR | Status: AC
  Administered 2015-06-09: 0.5 mg via INTRAVENOUS
  Filled 2015-06-09: qty 1

## 2015-06-09 MED ORDER — ACETAMINOPHEN 325 MG PO TABS
ORAL_TABLET | ORAL | Status: AC
  Filled 2015-06-09: qty 2

## 2015-06-09 MED ORDER — METOCLOPRAMIDE HCL 5 MG PO TABS
5.0000 mg | ORAL_TABLET | Freq: Three times a day (TID) | ORAL | Status: DC | PRN
  Filled 2015-06-09: qty 2

## 2015-06-09 MED ORDER — SODIUM CHLORIDE 0.9 % IJ SOLN
INTRAMUSCULAR | Status: DC | PRN
  Administered 2015-06-09 (×2): 10 mL

## 2015-06-09 MED ORDER — CHLORHEXIDINE GLUCONATE 4 % EX LIQD: 60.0000 mL | Freq: Once | CUTANEOUS | Status: DC

## 2015-06-09 MED ORDER — 0.9 % SODIUM CHLORIDE (POUR BTL) OPTIME
TOPICAL | Status: DC | PRN
  Administered 2015-06-09: 1000 mL

## 2015-06-09 MED ORDER — DEXTROSE 5 % IV SOLN
500.0000 mg | Freq: Four times a day (QID) | INTRAVENOUS | Status: DC | PRN
  Filled 2015-06-09: qty 5

## 2015-06-09 MED ORDER — MEPERIDINE HCL 25 MG/ML IJ SOLN: 6.2500 mg | INTRAMUSCULAR | Status: DC | PRN

## 2015-06-09 MED ORDER — ROSUVASTATIN CALCIUM 5 MG PO TABS
5.0000 mg | ORAL_TABLET | Freq: Every day | ORAL | Status: DC
  Administered 2015-06-09 – 2015-06-10 (×2): 5 mg via ORAL
  Filled 2015-06-09 (×2): qty 1

## 2015-06-09 MED ORDER — DEXTROSE 5 % IV SOLN
10.0000 mg | INTRAVENOUS | Status: DC | PRN
  Administered 2015-06-09: 40 ug/min via INTRAVENOUS

## 2015-06-09 MED ORDER — SENNOSIDES-DOCUSATE SODIUM 8.6-50 MG PO TABS: 1.0000 | ORAL_TABLET | Freq: Every evening | ORAL | Status: DC | PRN

## 2015-06-09 MED ORDER — FLEET ENEMA 7-19 GM/118ML RE ENEM: 1.0000 | ENEMA | Freq: Once | RECTAL | Status: DC | PRN

## 2015-06-09 MED ORDER — ONDANSETRON HCL 4 MG/2ML IJ SOLN
4.0000 mg | Freq: Four times a day (QID) | INTRAMUSCULAR | Status: DC | PRN
  Administered 2015-06-09: 4 mg via INTRAVENOUS
  Filled 2015-06-09 (×2): qty 2

## 2015-06-09 MED ORDER — METHOCARBAMOL 500 MG PO TABS
500.0000 mg | ORAL_TABLET | Freq: Four times a day (QID) | ORAL | Status: DC | PRN
  Administered 2015-06-09 – 2015-06-10 (×3): 500 mg via ORAL
  Filled 2015-06-09 (×2): qty 1

## 2015-06-09 MED ORDER — METOCLOPRAMIDE HCL 5 MG/ML IJ SOLN
5.0000 mg | Freq: Three times a day (TID) | INTRAMUSCULAR | Status: DC | PRN
  Filled 2015-06-09: qty 2

## 2015-06-09 MED ORDER — BUPIVACAINE-EPINEPHRINE (PF) 0.25% -1:200000 IJ SOLN
INTRAMUSCULAR | Status: AC
  Filled 2015-06-09: qty 30

## 2015-06-09 MED ORDER — LIDOCAINE 2% (20 MG/ML) 5 ML SYRINGE
INTRAMUSCULAR | Status: AC
  Filled 2015-06-09: qty 5

## 2015-06-09 MED ORDER — PANTOPRAZOLE SODIUM 40 MG PO TBEC
80.0000 mg | DELAYED_RELEASE_TABLET | Freq: Every day | ORAL | Status: DC
  Administered 2015-06-10: 80 mg via ORAL
  Filled 2015-06-09 (×2): qty 2

## 2015-06-09 MED ORDER — HYDROMORPHONE HCL 1 MG/ML IJ SOLN: 1.0000 mg | INTRAMUSCULAR | Status: DC | PRN

## 2015-06-09 MED ORDER — PROPOFOL 10 MG/ML IV BOLUS
INTRAVENOUS | Status: DC | PRN
  Administered 2015-06-09: 20 mg via INTRAVENOUS

## 2015-06-09 MED ORDER — SODIUM CHLORIDE 0.9 % IV SOLN
INTRAVENOUS | Status: DC
  Administered 2015-06-10: 03:00:00 via INTRAVENOUS

## 2015-06-09 MED ORDER — OXYCODONE HCL ER 10 MG PO T12A
10.0000 mg | EXTENDED_RELEASE_TABLET | Freq: Two times a day (BID) | ORAL | Status: DC
  Administered 2015-06-09: 10 mg via ORAL
  Filled 2015-06-09: qty 1

## 2015-06-09 MED ORDER — ATENOLOL 50 MG PO TABS
50.0000 mg | ORAL_TABLET | Freq: Every day | ORAL | Status: DC
  Administered 2015-06-10: 50 mg via ORAL
  Filled 2015-06-09 (×2): qty 1

## 2015-06-09 MED ORDER — BISACODYL 5 MG PO TBEC
5.0000 mg | DELAYED_RELEASE_TABLET | Freq: Every day | ORAL | Status: DC | PRN
  Administered 2015-06-10: 5 mg via ORAL
  Filled 2015-06-09: qty 1

## 2015-06-09 MED ORDER — ONDANSETRON HCL 4 MG PO TABS
4.0000 mg | ORAL_TABLET | Freq: Four times a day (QID) | ORAL | Status: DC | PRN
  Filled 2015-06-09: qty 1

## 2015-06-09 MED ORDER — LOSARTAN POTASSIUM 50 MG PO TABS
100.0000 mg | ORAL_TABLET | Freq: Every day | ORAL | Status: DC
  Administered 2015-06-10: 100 mg via ORAL
  Filled 2015-06-09 (×2): qty 2

## 2015-06-09 MED ORDER — BUPIVACAINE LIPOSOME 1.3 % IJ SUSP
20.0000 mL | INTRAMUSCULAR | Status: AC
  Administered 2015-06-09: 20 mL
  Filled 2015-06-09: qty 20

## 2015-06-09 MED ORDER — ONDANSETRON HCL 4 MG/2ML IJ SOLN: 4.0000 mg | Freq: Once | INTRAMUSCULAR | Status: DC | PRN

## 2015-06-09 MED ORDER — CELECOXIB 200 MG PO CAPS
200.0000 mg | ORAL_CAPSULE | Freq: Two times a day (BID) | ORAL | Status: DC
  Administered 2015-06-09: 200 mg via ORAL
  Filled 2015-06-09: qty 1

## 2015-06-09 MED ORDER — DIPHENHYDRAMINE HCL 12.5 MG/5ML PO ELIX: 12.5000 mg | ORAL_SOLUTION | ORAL | Status: DC | PRN

## 2015-06-09 MED ORDER — ACETAMINOPHEN 325 MG PO TABS
650.0000 mg | ORAL_TABLET | Freq: Four times a day (QID) | ORAL | Status: DC | PRN
  Administered 2015-06-09: 650 mg via ORAL
  Filled 2015-06-09 (×2): qty 2

## 2015-06-09 MED ORDER — METHOCARBAMOL 500 MG PO TABS
ORAL_TABLET | ORAL | Status: AC
  Filled 2015-06-09: qty 1

## 2015-06-09 MED ORDER — OXYCODONE HCL ER 10 MG PO T12A
10.0000 mg | EXTENDED_RELEASE_TABLET | Freq: Two times a day (BID) | ORAL | Status: DC
  Administered 2015-06-10: 10 mg via ORAL
  Filled 2015-06-09: qty 1

## 2015-06-09 MED ORDER — ROCURONIUM BROMIDE 50 MG/5ML IV SOLN
INTRAVENOUS | Status: AC
  Filled 2015-06-09: qty 1

## 2015-06-09 MED ORDER — HYDROMORPHONE HCL 1 MG/ML IJ SOLN
0.2500 mg | INTRAMUSCULAR | Status: DC | PRN
  Administered 2015-06-09 (×2): 0.5 mg via INTRAVENOUS

## 2015-06-09 MED ORDER — LEVOTHYROXINE SODIUM 75 MCG PO TABS
150.0000 ug | ORAL_TABLET | Freq: Every day | ORAL | Status: DC
  Administered 2015-06-10: 150 ug via ORAL
  Filled 2015-06-09: qty 1
  Filled 2015-06-09: qty 2

## 2015-06-09 MED ORDER — CEFAZOLIN SODIUM 1-5 GM-% IV SOLN
1.0000 g | Freq: Four times a day (QID) | INTRAVENOUS | Status: AC
  Administered 2015-06-09 (×2): 1 g via INTRAVENOUS
  Filled 2015-06-09 (×3): qty 50

## 2015-06-09 MED ORDER — FENTANYL CITRATE (PF) 100 MCG/2ML IJ SOLN
INTRAMUSCULAR | Status: DC | PRN
  Administered 2015-06-09 (×2): 25 ug via INTRAVENOUS
  Administered 2015-06-09: 50 ug via INTRAVENOUS

## 2015-06-09 MED ORDER — FENTANYL CITRATE (PF) 250 MCG/5ML IJ SOLN
INTRAMUSCULAR | Status: AC
  Filled 2015-06-09: qty 5

## 2015-06-09 MED ORDER — ARTIFICIAL TEARS OP OINT
TOPICAL_OINTMENT | OPHTHALMIC | Status: AC
  Filled 2015-06-09: qty 3.5

## 2015-06-09 MED ORDER — LACTATED RINGERS IV SOLN
INTRAVENOUS | Status: DC
  Administered 2015-06-09 (×2): via INTRAVENOUS

## 2015-06-09 MED ORDER — ONDANSETRON HCL 4 MG/2ML IJ SOLN
INTRAMUSCULAR | Status: AC
  Filled 2015-06-09: qty 2

## 2015-06-09 MED ORDER — MENTHOL 3 MG MT LOZG
1.0000 | LOZENGE | OROMUCOSAL | Status: DC | PRN
  Filled 2015-06-09: qty 9

## 2015-06-09 MED ORDER — CELECOXIB 200 MG PO CAPS
200.0000 mg | ORAL_CAPSULE | Freq: Two times a day (BID) | ORAL | Status: DC
  Administered 2015-06-10: 200 mg via ORAL
  Filled 2015-06-09: qty 1

## 2015-06-09 MED ORDER — ENOXAPARIN SODIUM 30 MG/0.3ML ~~LOC~~ SOLN
30.0000 mg | Freq: Two times a day (BID) | SUBCUTANEOUS | Status: DC
  Administered 2015-06-10: 30 mg via SUBCUTANEOUS
  Filled 2015-06-09: qty 0.3

## 2015-06-09 MED ORDER — SODIUM CHLORIDE 0.9 % IR SOLN
Status: DC | PRN
  Administered 2015-06-09 (×2): 1000 mL

## 2015-06-09 MED ORDER — PHENOL 1.4 % MT LIQD
1.0000 | OROMUCOSAL | Status: DC | PRN
  Filled 2015-06-09: qty 177

## 2015-06-09 MED ORDER — OXYCODONE HCL 5 MG PO TABS
5.0000 mg | ORAL_TABLET | ORAL | Status: DC | PRN
  Administered 2015-06-09 – 2015-06-10 (×5): 10 mg via ORAL
  Filled 2015-06-09 (×4): qty 2

## 2015-06-09 MED ORDER — ZOLPIDEM TARTRATE 5 MG PO TABS: 5.0000 mg | ORAL_TABLET | Freq: Every evening | ORAL | Status: DC | PRN

## 2015-06-09 MED ORDER — BUPIVACAINE IN DEXTROSE 0.75-8.25 % IT SOLN
INTRATHECAL | Status: DC | PRN
  Administered 2015-06-09: 2 mL via INTRATHECAL

## 2015-06-09 MED ORDER — ALUM & MAG HYDROXIDE-SIMETH 200-200-20 MG/5ML PO SUSP: 30.0000 mL | ORAL | Status: DC | PRN

## 2015-06-09 MED ORDER — OXYCODONE HCL 5 MG PO TABS
ORAL_TABLET | ORAL | Status: AC
  Filled 2015-06-09: qty 2

## 2015-06-09 MED ORDER — PROPOFOL 10 MG/ML IV BOLUS
INTRAVENOUS | Status: AC
  Filled 2015-06-09: qty 40

## 2015-06-09 MED ORDER — DILTIAZEM HCL ER COATED BEADS 180 MG PO CP24
360.0000 mg | ORAL_CAPSULE | Freq: Every day | ORAL | Status: DC
  Administered 2015-06-10: 360 mg via ORAL
  Filled 2015-06-09: qty 2
  Filled 2015-06-09: qty 1

## 2015-06-09 MED ORDER — MIDAZOLAM HCL 5 MG/5ML IJ SOLN
INTRAMUSCULAR | Status: DC | PRN
  Administered 2015-06-09: 2 mg via INTRAVENOUS

## 2015-06-09 MED ORDER — ACETAMINOPHEN 650 MG RE SUPP
650.0000 mg | Freq: Four times a day (QID) | RECTAL | Status: DC | PRN
  Filled 2015-06-09: qty 1

## 2015-06-09 MED ORDER — PROPOFOL 500 MG/50ML IV EMUL
INTRAVENOUS | Status: DC | PRN
  Administered 2015-06-09: 50 ug/kg/min via INTRAVENOUS

## 2015-06-09 MED ORDER — BUPIVACAINE-EPINEPHRINE (PF) 0.25% -1:200000 IJ SOLN
INTRAMUSCULAR | Status: DC | PRN
  Administered 2015-06-09: 30 mL

## 2015-06-09 MED ORDER — MIDAZOLAM HCL 2 MG/2ML IJ SOLN
INTRAMUSCULAR | Status: AC
  Filled 2015-06-09: qty 2

## 2015-06-09 MED ORDER — BUPIVACAINE-EPINEPHRINE 0.5% -1:200000 IJ SOLN: INTRAMUSCULAR | Status: DC | PRN

## 2015-06-09 SURGICAL SUPPLY — 60 items
BANDAGE ESMARK 6X9 LF (GAUZE/BANDAGES/DRESSINGS) ×1 IMPLANT
BLADE SAGITTAL 13X1.27X60 (BLADE) ×2 IMPLANT
BLADE SAGITTAL 13X1.27X60MM (BLADE) ×1
BLADE SAW SGTL 83.5X18.5 (BLADE) ×3 IMPLANT
BLADE SURG 10 STRL SS (BLADE) ×3 IMPLANT
BNDG CMPR 9X6 STRL LF SNTH (GAUZE/BANDAGES/DRESSINGS) ×1
BNDG ESMARK 6X9 LF (GAUZE/BANDAGES/DRESSINGS) ×3
BOWL SMART MIX CTS (DISPOSABLE) ×3 IMPLANT
CAPT KNEE TOTAL 3 ×3 IMPLANT
CEMENT BONE SIMPLEX SPEEDSET (Cement) ×6 IMPLANT
COVER SURGICAL LIGHT HANDLE (MISCELLANEOUS) ×3 IMPLANT
CUFF TOURNIQUET SINGLE 34IN LL (TOURNIQUET CUFF) ×3 IMPLANT
DRAPE EXTREMITY T 121X128X90 (DRAPE) ×3 IMPLANT
DRAPE INCISE IOBAN 66X45 STRL (DRAPES) ×6 IMPLANT
DRAPE PROXIMA HALF (DRAPES) IMPLANT
DRAPE U-SHAPE 47X51 STRL (DRAPES) ×3 IMPLANT
DRSG ADAPTIC 3X8 NADH LF (GAUZE/BANDAGES/DRESSINGS) ×3 IMPLANT
DRSG AQUACEL AG ADV 3.5X 6 (GAUZE/BANDAGES/DRESSINGS) ×2 IMPLANT
DRSG PAD ABDOMINAL 8X10 ST (GAUZE/BANDAGES/DRESSINGS) ×3 IMPLANT
DURAPREP 26ML APPLICATOR (WOUND CARE) ×6 IMPLANT
ELECT REM PT RETURN 9FT ADLT (ELECTROSURGICAL) ×3
ELECTRODE REM PT RTRN 9FT ADLT (ELECTROSURGICAL) ×1 IMPLANT
GAUZE SPONGE 4X4 12PLY STRL (GAUZE/BANDAGES/DRESSINGS) ×3 IMPLANT
GLOVE BIOGEL M 7.0 STRL (GLOVE) ×2 IMPLANT
GLOVE BIOGEL PI IND STRL 7.5 (GLOVE) IMPLANT
GLOVE BIOGEL PI IND STRL 8.5 (GLOVE) ×5 IMPLANT
GLOVE BIOGEL PI INDICATOR 7.5 (GLOVE)
GLOVE BIOGEL PI INDICATOR 8.5 (GLOVE) ×10
GLOVE SURG ORTHO 8.0 STRL STRW (GLOVE) ×18 IMPLANT
GOWN STRL REUS W/ TWL LRG LVL3 (GOWN DISPOSABLE) ×1 IMPLANT
GOWN STRL REUS W/ TWL XL LVL3 (GOWN DISPOSABLE) ×2 IMPLANT
GOWN STRL REUS W/TWL 2XL LVL3 (GOWN DISPOSABLE) ×3 IMPLANT
GOWN STRL REUS W/TWL LRG LVL3 (GOWN DISPOSABLE) ×3
GOWN STRL REUS W/TWL XL LVL3 (GOWN DISPOSABLE) ×6
HANDPIECE INTERPULSE COAX TIP (DISPOSABLE) ×3
HOOD PEEL AWAY FACE SHEILD DIS (HOOD) ×9 IMPLANT
KIT BASIN OR (CUSTOM PROCEDURE TRAY) ×3 IMPLANT
KIT ROOM TURNOVER OR (KITS) ×3 IMPLANT
KNEE CAPITATED TOTAL 3 IMPLANT
MANIFOLD NEPTUNE II (INSTRUMENTS) ×3 IMPLANT
NEEDLE 22X1 1/2 (OR ONLY) (NEEDLE) ×6 IMPLANT
NS IRRIG 1000ML POUR BTL (IV SOLUTION) ×3 IMPLANT
PACK TOTAL JOINT (CUSTOM PROCEDURE TRAY) ×3 IMPLANT
PACK UNIVERSAL I (CUSTOM PROCEDURE TRAY) ×3 IMPLANT
PAD ARMBOARD 7.5X6 YLW CONV (MISCELLANEOUS) ×6 IMPLANT
PADDING CAST COTTON 6X4 STRL (CAST SUPPLIES) ×3 IMPLANT
SET HNDPC FAN SPRY TIP SCT (DISPOSABLE) ×1 IMPLANT
STAPLER VISISTAT 35W (STAPLE) ×3 IMPLANT
SUCTION FRAZIER HANDLE 10FR (MISCELLANEOUS) ×2
SUCTION TUBE FRAZIER 10FR DISP (MISCELLANEOUS) ×1 IMPLANT
SUT BONE WAX W31G (SUTURE) ×3 IMPLANT
SUT VIC AB 0 CTB1 27 (SUTURE) ×6 IMPLANT
SUT VIC AB 1 CT1 27 (SUTURE) ×6
SUT VIC AB 1 CT1 27XBRD ANBCTR (SUTURE) ×2 IMPLANT
SUT VIC AB 2-0 CT1 27 (SUTURE) ×9
SUT VIC AB 2-0 CT1 TAPERPNT 27 (SUTURE) ×2 IMPLANT
SYR 20CC LL (SYRINGE) ×6 IMPLANT
TOWEL OR 17X24 6PK STRL BLUE (TOWEL DISPOSABLE) ×3 IMPLANT
TOWEL OR 17X26 10 PK STRL BLUE (TOWEL DISPOSABLE) ×3 IMPLANT
WATER STERILE IRR 1000ML POUR (IV SOLUTION) ×2 IMPLANT

## 2015-06-09 NOTE — Anesthesia Preprocedure Evaluation (Addendum)
Anesthesia Evaluation  Patient identified by MRN, date of birth, ID band Patient awake    Reviewed: Allergy & Precautions, NPO status , Patient's Chart, lab work & pertinent test results  Airway Mallampati: II  TM Distance: >3 FB Neck ROM: Full    Dental   Pulmonary sleep apnea ,    Pulmonary exam normal        Cardiovascular hypertension, Pt. on medications + CAD  Normal cardiovascular exam  Cardiac catheterization  10/15/2009  only mild CAD. 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 Normal EF.   Currently asymptomatic.        Neuro/Psych    GI/Hepatic GERD  Medicated and Controlled,  Endo/Other  Hypothyroidism   Renal/GU      Musculoskeletal   Abdominal   Peds  Hematology   Anesthesia Other Findings   Reproductive/Obstetrics                            Anesthesia Physical Anesthesia Plan  ASA: III  Anesthesia Plan: Spinal   Post-op Pain Management:    Induction: Intravenous  Airway Management Planned: Natural Airway  Additional Equipment:   Intra-op Plan:   Post-operative Plan:   Informed Consent: I have reviewed the patients History and Physical, chart, labs and discussed the procedure including the risks, benefits and alternatives for the proposed anesthesia with the patient or authorized representative who has indicated his/her understanding and acceptance.     Plan Discussed with: CRNA and Surgeon  Anesthesia Plan Comments:         Anesthesia Quick Evaluation

## 2015-06-09 NOTE — Op Note (Signed)
TOTAL KNEE REPLACEMENT OPERATIVE NOTE:  06/09/2015  3:48 PM  PATIENT:  Miguel Morgan  75 y.o. male  PRE-OPERATIVE DIAGNOSIS:  primary osteoarthritis left knee  POST-OPERATIVE DIAGNOSIS:  primary osteoarthritis left knee  PROCEDURE:  Procedure(s): TOTAL KNEE ARTHROPLASTY  SURGEON:  Surgeon(s): Vickey Huger, MD  PHYSICIAN ASSISTANT:Blair Mancel Bale, [facility]  ANESTHESIA:   spinal  DRAINS: Hemovac  SPECIMEN: None  COUNTS:  Correct  TOURNIQUET:   Total Tourniquet Time Documented: Thigh (Left) - 48 minutes Total: Thigh (Left) - 48 minutes   DICTATION:  Indication for procedure:    The patient is a 75 y.o. male who has failed conservative treatment for primary osteoarthritis left knee.  Informed consent was obtained prior to anesthesia. The risks versus benefits of the operation were explain and in a way the patient can, and did, understand.   On the implant demand matching protocol, this patient scored 10.  Therefore, this patient did" "did not receive a polyethylene insert with vitamin E which is a high demand implant.  Description of procedure:     The patient was taken to the operating room and placed under anesthesia.  The patient was positioned in the usual fashion taking care that all body parts were adequately padded and/or protected.  I foley catheter was not placed.  A tourniquet was applied and the leg prepped and draped in the usual sterile fashion.  The extremity was exsanguinated with the esmarch and tourniquet inflated to 350 mmHg.  Pre-operative range of motion was normal.  The knee was in 5 degree of mild varus.  A midline incision approximately 6-7 inches long was made with a #10 blade.  A new blade was used to make a parapatellar arthrotomy going 2-3 cm into the quadriceps tendon, over the patella, and alongside the medial aspect of the patellar tendon.  A synovectomy was then performed with the #10 blade and forceps. I then elevated the deep MCL off the medial  tibial metaphysis subperiosteally around to the semimembranosus attachment.    I everted the patella and used calipers to measure patellar thickness.  I used the reamer to ream down to appropriate thickness to recreate the native thickness.  I then removed excess bone with the rongeur and sagittal saw.  I used the appropriately sized template and drilled the three lug holes.  I then put the trial in place and measured the thickness with the calipers to ensure recreation of the native thickness.  The trial was then removed and the patella subluxed and the knee brought into flexion.  A homan retractor was place to retract and protect the patella and lateral structures.  A Z-retractor was place medially to protect the medial structures.  The extra-medullary alignment system was used to make cut the tibial articular surface perpendicular to the anamotic axis of the tibia and in 3 degrees of posterior slope.  The cut surface and alignment jig was removed.  I then used the intramedullary alignment guide to make a 6 valgus cut on the distal femur.  I then marked out the epicondylar axis on the distal femur.  The posterior condylar axis measured 3 degrees.  I then used the anterior referencing sizer and measured the femur to be a size 11.  The 4-In-1 cutting block was screwed into place in external rotation matching the posterior condylar angle, making our cuts perpendicular to the epicondylar axis.  Anterior, posterior and chamfer cuts were made with the sagittal saw.  The cutting block and cut pieces were  removed.  A lamina spreader was placed in 90 degrees of flexion.  The ACL, PCL, menisci, and posterior condylar osteophytes were removed.  A 12 mm spacer blocked was found to offer good flexion and extension gap balance after minimal in degree releasing.   The scoop retractor was then placed and the femoral finishing block was pinned in place.  The small sagittal saw was used as well as the lug drill to finish  the femur.  The block and cut surfaces were removed and the medullary canal hole filled with autograft bone from the cut pieces.  The tibia was delivered forward in deep flexion and external rotation.  A size G tray was selected and pinned into place centered on the medial 1/3 of the tibial tubercle.  The reamer and keel was used to prepare the tibia through the tray.    I then trialed with the size 11 femur, size G tibia, a 12 mm insert and the 35 patella.  I had excellent flexion/extension gap balance, excellent patella tracking.  Flexion was full and beyond 120 degrees; extension was zero.  These components were chosen and the staff opened them to me on the back table while the knee was lavaged copiously and the cement mixed.  The soft tissue was infiltrated with 60cc of exparel 1.3% through a 21 gauge needle.  I cemented in the components and removed all excess cement.  The polyethylene tibial component was snapped into place and the knee placed in extension while cement was hardening.  The capsule was infilltrated with 30cc of .25% Marcaine with epinephrine.  A hemovac was place in the joint exiting superolaterally.  A pain pump was place superomedially superficial to the arthrotomy.  Once the cement was hard, the tourniquet was let down.  Hemostasis was obtained.  The arthrotomy was closed with figure-8 #1 vicryl sutures.  The deep soft tissues were closed with #0 vicryls and the subcuticular layer closed with a running #2-0 vicryl.  The skin was reapproximated and closed with skin staples.  The wound was dressed with xeroform, 4 x4's, 2 ABD sponges, a single layer of webril and a TED stocking.   The patient was then awakened, extubated, and taken to the recovery room in stable condition.  BLOOD LOSS:  300cc DRAINS: 1 hemovac, 1 pain catheter COMPLICATIONS:  None.  PLAN OF CARE: Admit to inpatient   PATIENT DISPOSITION:  PACU - hemodynamically stable.   Delay start of Pharmacological VTE  agent (>24hrs) due to surgical blood loss or risk of bleeding:  not applicable  Please fax a copy of this op note to my office at [phone] (please only include page 1 and 2 of the Case Information op note)

## 2015-06-09 NOTE — Progress Notes (Signed)
Orthopedic Tech Progress Note Patient Details:  Miguel Morgan 1940/03/24 [id number]  CPM Left Knee CPM Left Knee: On Left Knee Flexion (Degrees): 9 Left Knee Extension (Degrees): 0 Additional Comments: trapeze bar patient helper Viewed order from doctor's order list  Hildred Priest 06/09/2015, 11:04 AM

## 2015-06-09 NOTE — Evaluation (Signed)
Physical Therapy Evaluation Patient Details Name: Miguel Morgan MRN: [id number] DOB: March 26, 1940 Today's Date: 06/09/2015   History of Present Illness  Patient is a 75 y/o male with hx of partial Rt knee replacement, CAD, HTN, HLD presents s/p left TKA.  Clinical Impression  Patient presents with pain, nausea and post surgical deficits LLE s/p left TKA. Tolerated gait training with min guard assist for safety. Distance limited due to nausea. Instructed pt in exercises and education provided on precautions. Pt will have support from wife at home. Will plan for stair training tomorrow prior to discharge. Will follow acutely to maximize independence and mobility prior to return home.    Follow Up Recommendations Home health PT;Supervision - Intermittent    Equipment Recommendations  None recommended by PT    Recommendations for Other Services OT consult     Precautions / Restrictions Precautions Precautions: Knee Precaution Booklet Issued: No Precaution Comments: Reviewed no pillow under knee and precautions Restrictions Weight Bearing Restrictions: Yes LLE Weight Bearing: Weight bearing as tolerated      Mobility  Bed Mobility Overal bed mobility: Needs Assistance Bed Mobility: Supine to Sit     Supine to sit: Supervision;HOB elevated     General bed mobility comments: Use of rails, no assist needed. + wooziness.  Transfers Overall transfer level: Needs assistance Equipment used: Rolling walker (2 wheeled) Transfers: Sit to/from Stand Sit to Stand: Min guard         General transfer comment: Min guard for safety. Cues for hand placement/technique. Transferred to chair post ambulation bout.  Ambulation/Gait Ambulation/Gait assistance: Min guard Ambulation Distance (Feet): 100 Feet Assistive device: Rolling walker (2 wheeled) Gait Pattern/deviations: Step-through pattern;Decreased stride length Gait velocity: decreased   General Gait Details: Cues for knee  extension during stance phase to activate quads. + nausea.  Stairs            Wheelchair Mobility    Modified Rankin (Stroke Patients Only)       Balance Overall balance assessment: Needs assistance Sitting-balance support: Feet supported;No upper extremity supported Sitting balance-Leahy Scale: Good     Standing balance support: During functional activity Standing balance-Leahy Scale: Fair                               Pertinent Vitals/Pain Pain Assessment: 0-10 Pain Score: 4  Pain Location: left knee Pain Descriptors / Indicators: Sore;Operative site guarding Pain Intervention(s): Monitored during session;Repositioned;Premedicated before session    [place] expects to be discharged to:: Private residence Living Arrangements: Spouse/significant other Available Help at Discharge: Family;Available 24 hours/day Type of Home: House Home Access: Stairs to enter Entrance Stairs-Rails: None Entrance Stairs-Number of Steps: 2 small steps Home Layout: One level Home Equipment: Walker - 2 wheels;Bedside commode;Tub bench      Prior Function Level of Independence: Independent               Hand Dominance        Extremity/Trunk Assessment   Upper Extremity Assessment: Defer to OT evaluation           Lower Extremity Assessment: LLE deficits/detail   LLE Deficits / Details: post surgical deficits LLE, able to perform LAQ.     Communication   Communication: No difficulties  Cognition Arousal/Alertness: Awake/alert Behavior During Therapy: WFL for tasks assessed/performed Overall Cognitive Status: Within Functional Limits for tasks assessed  General Comments General comments (skin integrity, edema, etc.): Spouse present during session.    Exercises Total Joint Exercises Ankle Circles/Pumps: Both;15 reps;Supine Quad Sets: Both;10 reps;Supine Gluteal Sets: Both;10 reps;Supine       Assessment/Plan    PT Assessment Patient needs continued PT services  PT Diagnosis Difficulty walking;Acute pain   PT Problem List Decreased strength;Pain;Decreased range of motion;Decreased activity tolerance;Decreased balance;Decreased mobility;Decreased knowledge of precautions  PT Treatment Interventions Balance training;Gait training;Stair training;Functional mobility training;Therapeutic activities;Therapeutic exercise;Patient/family education   PT Goals (Current goals can be found in the Care Plan section) Acute Rehab PT Goals Patient Stated Goal: to be able to walk again  PT Goal Formulation: With patient Time For Goal Achievement: 06/23/15 Potential to Achieve Goals: Good    Frequency 7X/week   Barriers to discharge Inaccessible home environment 2 steps    Co-evaluation               End of Session Equipment Utilized During Treatment: Gait belt Activity Tolerance: Patient tolerated treatment well Patient left: in chair;with call bell/phone within reach;with family/visitor present Nurse Communication: Mobility status         Time: [id number] PT Time Calculation (min) (ACUTE ONLY): 29 min   Charges:   PT Evaluation $PT Eval Moderate Complexity: 1 Procedure PT Treatments $Gait Training: 8-22 mins   PT G Codes:        Miguel Morgan 06/09/2015, 4:48 PM Wray Kearns, [place], DPT [phone]

## 2015-06-09 NOTE — Progress Notes (Signed)
Orthopedic Tech Progress Note Patient Details:  Miguel Morgan Sep 14, 1940 [id number] Ortho visit put on cpm at [place] Patient ID: Miguel Morgan, male   DOB: [birth year], 75 y.o.   MRN: [id number]   Miguel Morgan 06/09/2015, 6:38 PM

## 2015-06-09 NOTE — Anesthesia Postprocedure Evaluation (Signed)
Anesthesia Post Note  Patient: Miguel Morgan  Procedure(s) Performed: Procedure(s) (LRB): TOTAL KNEE ARTHROPLASTY (Left)  Patient location during evaluation: PACU Anesthesia Type: Spinal and MAC Level of consciousness: awake and alert Pain management: pain level controlled Vital Signs Assessment: post-procedure vital signs reviewed and stable Respiratory status: spontaneous breathing and respiratory function stable Cardiovascular status: blood pressure returned to baseline and stable Postop Assessment: spinal receding Anesthetic complications: no    Last Vitals:  Filed Vitals:   06/09/15 1315 06/09/15 1358  BP:  117/72  Pulse: 50 50  Temp:  36.6 C  Resp: 12 15    Last Pain:  Filed Vitals:   06/09/15 1508  PainSc: 3                  Oshae Simmering DAVID

## 2015-06-09 NOTE — H&P (Signed)
Miguel Morgan MRN:  [id number] DOB/SEX:  1940-12-11/male  CHIEF COMPLAINT:  Painful left Knee  HISTORY: Patient is a 75 y.o. male presented with a history of pain in the left knee. Onset of symptoms was gradual starting a few years ago with gradually worsening course since that time. Patient has been treated conservatively with over-the-counter NSAIDs and activity modification. Patient currently rates pain in the knee at 10 out of 10 with activity. There is pain at night.  PAST MEDICAL HISTORY: Patient Active Problem List   Diagnosis Date Noted  . Hypothyroid 06/29/2011  . Preop cardiovascular exam 04/09/2010  . Coronary artery disease   . Hyperlipidemia   . Hypertension    Past Medical History  Diagnosis Date  . Thyroid disease     hypothyroidism  . GERD (gastroesophageal reflux disease)   . Coronary artery disease     nonobstructive CAD  . Hyperlipidemia   . Hypertension   . Sleep apnea     uses cpap  . Pneumonia   . Kidney stones   . Arthritis   . Diverticulitis   . Hypothyroidism    Past Surgical History  Procedure Laterality Date  . Knee surgery      right - partial knee replacement  . Hemorrhoid surgery    . Carpal tunnel release Right   . Cataract surgery Bilateral     with lens implant  . Cardiac catheterization  10/15/2009    only mild CAD. 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 Normal EF.   . Lithotripsy    . Vasectomy    . Colonoscopy       MEDICATIONS:   Prescriptions prior to admission  Medication Sig Dispense Refill Last Dose  . Ascorbic Acid (VITAMIN C) 500 MG tablet Take 500 mg by mouth daily.     Taking  . aspirin 325 MG tablet Take 325 mg by mouth daily.     Taking  . atenolol (TENORMIN) 50 MG tablet Take 1 tablet (50 mg total) by mouth daily. 90 tablet 3 Taking  . Cholecalciferol (VITAMIN D) 2000 UNITS tablet Take 2,000 Units by mouth daily.     Taking  . diltiazem (CARDIZEM CD) 360 MG 24 hr capsule Take 1  capsule (360 mg total) by mouth daily. 90 capsule 3 Taking  . fish oil-omega-3 fatty acids 1000 MG capsule Take 2 g by mouth daily.    Taking  . levothyroxine (SYNTHROID) 150 MCG tablet Take 1 tablet (150 mcg total) by mouth daily. 90 tablet 3 Taking  . losartan (COZAAR) 100 MG tablet Take 1 tablet (100 mg total) by mouth daily. 90 tablet 3 Taking  . omeprazole (PRILOSEC) 40 MG capsule Take 40 mg by mouth daily.     Taking  . psyllium (METAMUCIL) 58.6 % powder Take 1 packet by mouth 3 (three) times daily. One scoop daily    Taking  . rosuvastatin (CRESTOR) 5 MG tablet Take 1 tablet (5 mg total) by mouth daily. 90 tablet 3 Taking    ALLERGIES:   Allergies  Allergen Reactions  . Oxycodone Other (See Comments)    Caused flushing and sweating    REVIEW OF SYSTEMS:  A comprehensive review of systems was negative except for: Musculoskeletal: positive for arthralgias and stiff joints   FAMILY HISTORY:   Family History  Problem Relation Age of Onset  . Hypertension Mother   . Stomach cancer Father     SOCIAL HISTORY:   Social History  Substance  Use Topics  . Smoking status: Never Smoker   . Smokeless tobacco: Never Used  . Alcohol Use: No     EXAMINATION:  Vital signs in last 24 hours:    There were no vitals taken for this visit.  General Appearance:    Alert, cooperative, no distress, appears stated age  Head:    Normocephalic, without obvious abnormality, atraumatic  Eyes:    PERRL, conjunctiva/corneas clear, EOM's intact, fundi    benign, both eyes       Ears:    Normal TM's and external ear canals, both ears  Nose:   Nares normal, septum midline, mucosa normal, no drainage    or sinus tenderness  Throat:   Lips, mucosa, and tongue normal; teeth and gums normal  Neck:   Supple, symmetrical, trachea midline, no adenopathy;       thyroid:  No enlargement/tenderness/nodules; no carotid   bruit or JVD  Back:     Symmetric, no curvature, ROM normal, no CVA tenderness   Lungs:     Clear to auscultation bilaterally, respirations unlabored  Chest wall:    No tenderness or deformity  Heart:    Regular rate and rhythm, S1 and S2 normal, no murmur, rub   or gallop  Abdomen:     Soft, non-tender, bowel sounds active all four quadrants,    no masses, no organomegaly  Genitalia:    Normal male without lesion, discharge or tenderness  Rectal:    Normal tone, normal prostate, no masses or tenderness;   guaiac negative stool  Extremities:   Extremities normal, atraumatic, no cyanosis or edema  Pulses:   2+ and symmetric all extremities  Skin:   Skin color, texture, turgor normal, no rashes or lesions  Lymph nodes:   Cervical, supraclavicular, and axillary nodes normal  Neurologic:   CNII-XII intact. Normal strength, sensation and reflexes      throughout    Musculoskeletal:  ROM 0-120, Ligaments intact,  Imaging Review Plain radiographs demonstrate severe degenerative joint disease of the left knee. The overall alignment is neutral. The bone quality appears to be excellent for age and reported activity level.  Assessment/Plan: Primary osteoarthritis, left knee   The patient history, physical examination and imaging studies are consistent with advanced degenerative joint disease of the left knee. The patient has failed conservative treatment.  The clearance notes were reviewed.  After discussion with the patient it was felt that Total Knee Replacement was indicated. The procedure,  risks, and benefits of total knee arthroplasty were presented and reviewed. The risks including but not limited to aseptic loosening, infection, blood clots, vascular injury, stiffness, patella tracking problems complications among others were discussed. The patient acknowledged the explanation, agreed to proceed with the plan.  Donia Ast 06/09/2015, 6:30 AM

## 2015-06-09 NOTE — Anesthesia Procedure Notes (Addendum)
Procedure Name: MAC Performed by: Maryland Pink Pre-anesthesia Checklist: Patient identified, Suction available, Emergency Drugs available, Patient being monitored and Timeout performed Patient Re-evaluated:Patient Re-evaluated prior to inductionOxygen Delivery Method: Simple face mask Preoxygenation: Pre-oxygenation with 100% oxygen Intubation Type: IV induction Placement Confirmation: positive ETCO2   Spinal Patient location during procedure: OR Start time: 06/09/2015 8:40 AM End time: 06/09/2015 8:44 AM Staffing Anesthesiologist: Lillia Abed Performed by: anesthesiologist  Preanesthetic Checklist Completed: patient identified, site marked, surgical consent, pre-op evaluation, timeout performed, IV checked, risks and benefits discussed and monitors and equipment checked Spinal Block Patient position: sitting Prep: Betadine Patient monitoring: heart rate, cardiac monitor, continuous pulse ox and blood pressure Approach: right paramedian Location: L2-3 Injection technique: single-shot Needle Needle type: Pencan  Needle gauge: 24 G Needle length: 9 cm Needle insertion depth: 8 cm

## 2015-06-09 NOTE — Transfer of Care (Signed)
Immediate Anesthesia Transfer of Care Note  Patient: Miguel Morgan  Procedure(s) Performed: Procedure(s): TOTAL KNEE ARTHROPLASTY (Left)  Patient Location: PACU  Anesthesia Type:Spinal  Level of Consciousness: awake, alert  and oriented  Airway & Oxygen Therapy: Patient Spontanous Breathing  Post-op Assessment: Report given to RN and Post -op Vital signs reviewed and stable  Post vital signs: Reviewed and stable  Last Vitals:  Filed Vitals:   06/09/15 0655 06/09/15 1033  BP: 171/89 104/68  Pulse: 62 51  Temp: 37.1 C 36.2 C  Resp: 20 17    Last Pain: There were no vitals filed for this visit.    Patients Stated Pain Goal: 5 ([id number] [phone])  Complications: No apparent anesthesia complications

## 2015-06-10 ENCOUNTER — Encounter (HOSPITAL_COMMUNITY): Payer: Self-pay | Admitting: Orthopedic Surgery

## 2015-06-10 DIAGNOSIS — M1712 Unilateral primary osteoarthritis, left knee: Secondary | ICD-10-CM | POA: Diagnosis not present

## 2015-06-10 LAB — CBC
HEMATOCRIT: 38.6 % — AB (ref 39.0–52.0)
Hemoglobin: 12.3 g/dL — ABNORMAL LOW (ref 13.0–17.0)
MCH: 28.1 pg (ref 26.0–34.0)
MCHC: 31.9 g/dL (ref 30.0–36.0)
MCV: 88.3 fL (ref 78.0–100.0)
PLATELETS: 142 10*3/uL — AB (ref 150–400)
RBC: 4.37 MIL/uL (ref 4.22–5.81)
RDW: 14.1 % (ref 11.5–15.5)
WBC: 10.5 10*3/uL (ref 4.0–10.5)

## 2015-06-10 LAB — BASIC METABOLIC PANEL
ANION GAP: 7 (ref 5–15)
BUN: 8 mg/dL (ref 6–20)
CALCIUM: 8.7 mg/dL — AB (ref 8.9–10.3)
CO2: 26 mmol/L (ref 22–32)
Chloride: 104 mmol/L (ref 101–111)
Creatinine, Ser: 0.82 mg/dL (ref 0.61–1.24)
GFR calc Af Amer: >60 mL/min (ref >60)
GLUCOSE: 126 mg/dL — AB (ref 65–99)
Potassium: 3.7 mmol/L (ref 3.5–5.1)
Sodium: 137 mmol/L (ref 135–145)

## 2015-06-10 MED ORDER — ENOXAPARIN (LOVENOX) PATIENT EDUCATION KIT
PACK | Freq: Once | Status: DC
  Filled 2015-06-10: qty 1

## 2015-06-10 MED ORDER — OXYCODONE HCL ER 10 MG PO T12A
10.0000 mg | EXTENDED_RELEASE_TABLET | Freq: Two times a day (BID) | ORAL | Status: DC
Start: 2015-06-10 — End: 2020-03-03

## 2015-06-10 MED ORDER — ONDANSETRON HCL 4 MG PO TABS: 4.0000 mg | ORAL_TABLET | Freq: Four times a day (QID) | ORAL | Status: DC | PRN

## 2015-06-10 MED ORDER — METHOCARBAMOL 500 MG PO TABS: 500.0000 mg | ORAL_TABLET | Freq: Four times a day (QID) | ORAL | Status: DC | PRN

## 2015-06-10 MED ORDER — ENOXAPARIN SODIUM 40 MG/0.4ML ~~LOC~~ SOLN: 40.0000 mg | SUBCUTANEOUS | Status: DC

## 2015-06-10 MED ORDER — OXYCODONE HCL 5 MG PO TABS: 5.0000 mg | ORAL_TABLET | ORAL | Status: DC | PRN

## 2015-06-10 NOTE — Care Management Note (Addendum)
Case Management Note  Patient Details  Name: Miguel Morgan MRN: [id number] Date of Birth: September 20, 1940  Subjective/Objective:  75 y.o. M admitted 06/09/2015 for Partial R TKA from home where he lives with spouse. S/p L Tka. PT has recommended HHPT which will be provided by [facility] Ar[facility] Go). Confirmed with Linus Mako. HH rep. No further CM needs at this time. Pt hass all DME.                   Action/Plan:Discharge.    Expected Discharge Date:                  Expected Discharge Plan:  [name]  In-House Referral:     Discharge planning Services  CM Consult  Post Acute Care Choice:    Choice offered to:  Patient  DME Arranged:    DME Agency:     HH Arranged:  PT HH Agency:  [facility]  Status of Service:  Completed, signed off  Medicare Important Message Given:  Yes Date Medicare IM Given:    Medicare IM give by:    Date Additional Medicare IM Given:    Additional Medicare Important Message give by:     If discussed at [place] of Stay Meetings, dates discussed:    Additional Comments:  Delrae Sawyers, RN 06/10/2015, 3:58 PM

## 2015-06-10 NOTE — Progress Notes (Signed)
Patient stated he would like some pain medication before going home.  Oxy IR administered at 1637 per request.  DC instructions to patient and spouse. Spouse educated on lovenox injections and lovenox kit given to  Her.  Denies any questions regarding DC instructions and medications.

## 2015-06-10 NOTE — Progress Notes (Signed)
Physical Therapy Treatment Patient Details Name: Miguel Morgan MRN: [id number] DOB: May 12, 1940 Today's Date: 06/10/2015    History of Present Illness Patient is a 75 y/o male with hx of partial Rt knee replacement, CAD, HTN, HLD presents s/p left TKA.    PT Comments    Pt performed increased gait and successful completion of stair training x 2 trials.  Pt wil require pm session to review stair training before d/c home.    Follow Up Recommendations  Home health PT;Supervision - Intermittent     Equipment Recommendations  None recommended by PT    Recommendations for Other Services       Precautions / Restrictions Precautions Precautions: Knee Precaution Booklet Issued: No Precaution Comments: Reviewed no pillow under knee and precautions Restrictions Weight Bearing Restrictions: Yes LLE Weight Bearing: Weight bearing as tolerated    Mobility  Bed Mobility Overal bed mobility: Modified Independent Bed Mobility: Supine to Sit     Supine to sit: Modified independent (Device/Increase time) (HOB flat and no rail)     General bed mobility comments: Pt sitting in recliner chair on arrival.    Transfers Overall transfer level: Needs assistance Equipment used: Rolling walker (2 wheeled) Transfers: Sit to/from Stand Sit to Stand: Min guard         General transfer comment: Min guard for safety. Cues for hand placement/technique. Transferred to chair post ambulation bout.  Ambulation/Gait Ambulation/Gait assistance: Min guard Ambulation Distance (Feet): 250 Feet Assistive device: Rolling walker (2 wheeled) Gait Pattern/deviations: Step-through pattern;Decreased stride length;Antalgic;Trunk flexed Gait velocity: decreased   General Gait Details: Cues for knee extension during stance phase.  Pt Required cues for upper trunk control and gait symmetry with step through sequencing.     Stairs Stairs: Yes Stairs assistance: Min assist Stair Management: No  rails;With walker Number of Stairs: 4 General stair comments: Cues for sequencing and RW placement. Will instruct wife this pm.    Wheelchair Mobility    Modified Rankin (Stroke Patients Only)       Balance Overall balance assessment: Needs assistance Sitting-balance support: No upper extremity supported;Feet supported Sitting balance-Leahy Scale: Good     Standing balance support: Bilateral upper extremity supported;During functional activity   Standing balance comment: reliant on RW                    Cognition Arousal/Alertness: Awake/alert Behavior During Therapy: WFL for tasks assessed/performed Overall Cognitive Status: Within Functional Limits for tasks assessed                      Exercises Total Joint Exercises Ankle Circles/Pumps: Both;Supine;10 reps;AROM Quad Sets: Left;10 reps;Supine;AROM Short Arc Quad: AROM;Left;10 reps;Supine Heel Slides: AROM;10 reps;Left;Supine Hip ABduction/ADduction: AROM;Left;10 reps;Supine Straight Leg Raises: AROM;Left;10 reps;Supine Long Arc Quad: AROM;Seated;Left;10 reps    General Comments        Pertinent Vitals/Pain Pain Assessment: 0-10 Pain Score: 2  Pain Location: left knee Pain Descriptors / Indicators: Aching;Sore Pain Intervention(s): Limited activity within patient's tolerance;Monitored during session    [place] expects to be discharged to:: Private residence Living Arrangements: Spouse/significant other Available Help at Discharge: Family;Available 24 hours/day Type of Home: House Home Access: Stairs to enter Entrance Stairs-Rails: None Home Layout: One level Home Equipment: Environmental consultant - 2 wheels;Bedside commode;Shower seat      Prior Function Level of Independence: Independent          PT Goals (current goals can now be found in the care plan  section) Acute Rehab PT Goals Patient Stated Goal: to go home today    Frequency  7X/week    PT Plan Current plan remains  appropriate    Co-evaluation             End of Session Equipment Utilized During Treatment: Gait belt Activity Tolerance: Patient tolerated treatment well Patient left: in chair;with call bell/phone within reach;with family/visitor present     Time: [id number] PT Time Calculation (min) (ACUTE ONLY): 24 min  Charges:  $Gait Training: 8-22 mins $Therapeutic Exercise: 8-22 mins                    G Codes:      Cristela Blue 07/08/2015, 11:22 AM  Governor Rooks, PTA pager [phone]

## 2015-06-10 NOTE — Progress Notes (Signed)
Physical Therapy Treatment Patient Details Name: Miguel Morgan MRN: [id number] DOB: 10/31/1940 Today's Date: 06/10/2015    History of Present Illness Patient is a 75 y/o male with hx of partial Rt knee replacement, CAD, HTN, HLD presents s/p left TKA.    PT Comments    Pt set to d/c from mobility standpoint.  Pt reports feeling sleepy.  PTA reviewed HEP. Issued HEP and reviewed stair training with patient and spouse present.    Follow Up Recommendations  Home health PT;Supervision - Intermittent     Equipment Recommendations  None recommended by PT    Recommendations for Other Services       Precautions / Restrictions Precautions Precautions: Knee Precaution Booklet Issued: No Precaution Comments: Reviewed no pillow under knee and precautions Restrictions Weight Bearing Restrictions: Yes LLE Weight Bearing: Weight bearing as tolerated    Mobility  Bed Mobility Overal bed mobility: Modified Independent Bed Mobility: Supine to Sit     Supine to sit: Modified independent (Device/Increase time) (HOB flat and no rail)     General bed mobility comments: Pt recieved sitting edge of bed with wife present.    Transfers Overall transfer level: Needs assistance Equipment used: Rolling walker (2 wheeled) Transfers: Sit to/from Stand Sit to Stand: Min guard         General transfer comment: Min guard for safety. Cues for hand placement/technique. Pt lethargic, reports feeling sleepy.    Ambulation/Gait Ambulation/Gait assistance: Supervision Ambulation Distance (Feet): 350 Feet Assistive device: Rolling walker (2 wheeled) Gait Pattern/deviations: Step-through pattern;Decreased stride length;Antalgic;Trunk flexed Gait velocity: decreased   General Gait Details: Cues for knee extension during stance phase.  Pt Required cues for upper trunk control and gait symmetry with step through sequencing.     Stairs Stairs: Yes Stairs assistance: Min assist Stair  Management: No rails;With walker Number of Stairs: 4 General stair comments: Wife present and performed two separate trials with patient to ensure correct technique.  PTA provided VCs on 1st trial and allowed pt and wife to teach back method.    Wheelchair Mobility    Modified Rankin (Stroke Patients Only)       Balance Overall balance assessment: Needs assistance Sitting-balance support: No upper extremity supported;Feet supported Sitting balance-Leahy Scale: Good     Standing balance support: Bilateral upper extremity supported;During functional activity Standing balance-Leahy Scale: Fair Standing balance comment: reliant on RW                    Cognition Arousal/Alertness: Lethargic;Suspect due to medications Behavior During Therapy: [facility] for tasks assessed/performed Overall Cognitive Status: Within Functional Limits for tasks assessed                      Exercises Total Joint Exercises Ankle Circles/Pumps: Both;Supine;10 reps;AROM Quad Sets: Left;10 reps;Supine;AROM Gluteal Sets: Both;10 reps;Supine Short Arc Quad: AROM;Left;10 reps;Supine Heel Slides: AROM;10 reps;Left;Supine Hip ABduction/ADduction: AROM;Left;10 reps;Supine Straight Leg Raises: AROM;Left;10 reps;Supine Long Arc Quad: AROM;Seated;Left;10 reps Knee Flexion: AAROM;AROM;Left;5 reps;Seated (1x5 AROM with 10 sec hold and 1x5 reps AAROM with 10 sec hold. ) Goniometric ROM: 6-94 degrees.  L knee.      General Comments        Pertinent Vitals/Pain Pain Assessment: 0-10 Pain Score: 2  Pain Location: L knee Pain Descriptors / Indicators: Guarding;Sore;Discomfort Pain Intervention(s): Monitored during session;Repositioned;Ice applied    Home Living Family/patient expects to be discharged to:: Private residence Living Arrangements: Spouse/significant other Available Help at Discharge: Family;Available 24 hours/day Type  of Home: House Home Access: Stairs to enter Entrance  Stairs-Rails: None Home Layout: One level Home Equipment: Environmental consultant - 2 wheels;Bedside commode;Shower seat      Prior Function Level of Independence: Independent          PT Goals (current goals can now be found in the care plan section) Acute Rehab PT Goals Patient Stated Goal: to go home today Potential to Achieve Goals: Good Progress towards PT goals: Progressing toward goals    Frequency  7X/week    PT Plan Current plan remains appropriate    Co-evaluation             End of Session Equipment Utilized During Treatment: Gait belt Activity Tolerance: Patient tolerated treatment well Patient left: in chair;with call bell/phone within reach;with family/visitor present     Time: 1355-1436 PT Time Calculation (min) (ACUTE ONLY): 41 min  Charges:  $Gait Training: 8-22 mins $Therapeutic Exercise: 23-37 mins                    G Codes:      Cristela Blue 07-03-15, 2:41 PM Governor Rooks, PTA pager [phone]

## 2015-06-10 NOTE — Discharge Summary (Signed)
SPORTS MEDICINE & JOINT REPLACEMENT   Miguel Mulch, MD    Miguel Shadow, PA-C [place], [place], [place]  [address]                             [phone]  PATIENT ID: Miguel Morgan        MRN:  [id number]          DOB/AGE: [birth year] / 75 y.o.    DISCHARGE SUMMARY  ADMISSION DATE:    06/09/2015 DISCHARGE DATE:   06/10/2015   ADMISSION DIAGNOSIS: primary osteoarthritis left knee    DISCHARGE DIAGNOSIS:  primary osteoarthritis left knee    ADDITIONAL DIAGNOSIS: Active Problems:   S/P total knee replacement  Past Medical History  Diagnosis Date  . Thyroid disease     hypothyroidism  . GERD (gastroesophageal reflux disease)   . Coronary artery disease     nonobstructive CAD  . Hyperlipidemia   . Hypertension   . Sleep apnea     uses cpap  . Pneumonia   . Kidney stones   . Arthritis   . Diverticulitis   . Hypothyroidism     PROCEDURE: Procedure(s): TOTAL KNEE ARTHROPLASTY on 06/09/2015  CONSULTS:     HISTORY:  See H&P in chart  HOSPITAL COURSE:  Miguel Morgan is a 76 y.o. admitted on 06/09/2015 and found to have a diagnosis of primary osteoarthritis left knee.  After appropriate laboratory studies were obtained  they were taken to the operating room on 06/09/2015 and underwent Procedure(s): TOTAL KNEE ARTHROPLASTY.   They were given perioperative antibiotics:  Anti-infectives    Start     Dose/Rate Route Frequency Ordered Stop   06/09/15 1500  ceFAZolin (ANCEF) IVPB 1 g/50 mL premix     1 g 100 mL/hr over 30 Minutes Intravenous Every 6 hours 06/09/15 1402 06/09/15 2139   06/09/15 0800  ceFAZolin (ANCEF) IVPB 2g/100 mL premix     2 g 200 mL/hr over 30 Minutes Intravenous To [facility] 06/06/15 1153 06/09/15 0845    .  Patient given tranexamic acid IV or topical and exparel intra-operatively.  Tolerated the procedure well.    POD# 1: Vital signs were stable.  Patient denied Chest pain, shortness of breath, or calf pain.  Patient was  started on Lovenox 30 mg subcutaneously twice daily at 8am.  Consults to PT, OT, and care management were made.  The patient was weight bearing as tolerated.  CPM was placed on the operative leg 0-90 degrees for 6-8 hours a day. When out of the CPM, patient was placed in the foam block to achieve full extension. Incentive spirometry was taught.  Dressing was changed.       POD #2, Continued  PT for ambulation and exercise program.  IV saline locked.  O2 discontinued.    The remainder of the hospital course was dedicated to ambulation and strengthening.   The patient was discharged on 1 Day Post-Op in  Good condition.  Blood products given:none  DIAGNOSTIC STUDIES: Recent vital signs: Patient Vitals for the past 24 hrs:  BP Temp Temp src Pulse Resp SpO2  06/10/15 0829 (!) 153/67 mmHg 98.7 F (37.1 C) Oral 80 18 95 %  06/10/15 0545 (!) 142/66 mmHg 98.5 F (36.9 C) Oral 80 16 93 %  06/09/15 2359 (!) 142/65 mmHg 98.9 F (37.2 C) Oral 62 15 96 %  06/09/15 2028 140/67 mmHg 98.9 F (37.2 C)  Oral 60 15 95 %  06/09/15 1358 117/72 mmHg 97.9 F (36.6 C) Oral (!) 50 15 96 %  06/09/15 1315 - - - (!) 50 12 96 %  06/09/15 1300 116/66 mmHg - - (!) 50 13 96 %  06/09/15 1245 121/67 mmHg - - (!) 54 13 94 %       Recent laboratory studies:  Recent Labs  06/09/15 1515 06/10/15 0541  WBC 12.7* 10.5  HGB 12.6* 12.3*  HCT 39.5 38.6*  PLT 155 142*    Recent Labs  06/09/15 1515 06/10/15 0541  NA  --  137  K  --  3.7  CL  --  104  CO2  --  26  BUN  --  8  CREATININE 0.87 0.82  GLUCOSE  --  126*  CALCIUM  --  8.7*   Lab Results  Component Value Date   INR 1.00 05/28/2015   INR 1.04 10/15/2009     Recent Radiographic Studies :  Dg Chest 2 View  05/28/2015  CLINICAL DATA:  Recent history of pneumonia. Total knee replacement. EXAM: CHEST  2 VIEW COMPARISON:  03/03/2008. FINDINGS: Mediastinum hilar structures normal. Low lung volumes with bibasilar atelectasis and/or infiltrates. Similar  findings noted on prior exam. These changes may be would also related scarring. Tiny bilateral pleural effusions versus pleural scarring again noted. No pneumothorax. Heart size normal. Degenerative changes thoracic spine IMPRESSION: 1. Low lung volumes with mild bibasilar atelectasis and/or infiltrates. Similar findings noted on prior exam. These changes may also be related to scarring. 2. Tiny bilateral pleural effusions versus pleural scarring again noted. Electronically Signed   By: Marcello Moores  Register   On: 05/28/2015 12:32    DISCHARGE INSTRUCTIONS:   DISCHARGE MEDICATIONS:     Medication List    TAKE these medications        aspirin 325 MG tablet  Take 325 mg by mouth daily.     atenolol 50 MG tablet  Commonly known as:  TENORMIN  Take 1 tablet (50 mg total) by mouth daily.     diltiazem 360 MG 24 hr capsule  Commonly known as:  CARDIZEM CD  Take 1 capsule (360 mg total) by mouth daily.     enoxaparin 40 MG/0.4ML injection  Commonly known as:  LOVENOX  Inject 0.4 mLs (40 mg total) into the skin daily.     fish oil-omega-3 fatty acids 1000 MG capsule  Take 2 g by mouth daily.     levothyroxine 150 MCG tablet  Commonly known as:  SYNTHROID  Take 1 tablet (150 mcg total) by mouth daily.     losartan 100 MG tablet  Commonly known as:  COZAAR  Take 1 tablet (100 mg total) by mouth daily.     methocarbamol 500 MG tablet  Commonly known as:  ROBAXIN  Take 1-2 tablets (500-1,000 mg total) by mouth every 6 (six) hours as needed for muscle spasms.     omeprazole 40 MG capsule  Commonly known as:  PRILOSEC  Take 40 mg by mouth daily.     ondansetron 4 MG tablet  Commonly known as:  ZOFRAN  Take 1 tablet (4 mg total) by mouth every 6 (six) hours as needed for nausea.     oxyCODONE 5 MG immediate release tablet  Commonly known as:  Oxy IR/ROXICODONE  Take 1-2 tablets (5-10 mg total) by mouth every 3 (three) hours as needed for breakthrough pain.     oxyCODONE 10 mg 12 hr  tablet  Commonly known as:  OXYCONTIN  Take 1 tablet (10 mg total) by mouth every 12 (twelve) hours.     psyllium 58.6 % powder  Commonly known as:  METAMUCIL  Take 1 packet by mouth 3 (three) times daily. One scoop daily     rosuvastatin 5 MG tablet  Commonly known as:  CRESTOR  Take 1 tablet (5 mg total) by mouth daily.     vitamin C 500 MG tablet  Commonly known as:  ASCORBIC ACID  Take 500 mg by mouth daily.     Vitamin D 2000 units tablet  Take 2,000 Units by mouth daily.        FOLLOW UP VISIT:    DISPOSITION: HOME VS. SNF  CONDITION:  Good   Donia Ast 06/10/2015, 12:41 PM

## 2015-06-10 NOTE — Care Management Important Message (Signed)
Important Message  Patient Details  Name: Miguel Morgan MRN: [id number] Date of Birth: 20-Oct-1940   Medicare Important Message Given:  Yes    Loann Quill 06/10/2015, 8:27 AM

## 2015-06-10 NOTE — Evaluation (Signed)
Occupational Therapy Evaluation and Discharge Patient Details Name: Miguel Morgan MRN: [id number] DOB: October 14, 1940 Today's Date: 06/10/2015    History of Present Illness Patient is a 75 y/o male with hx of partial Rt knee replacement, CAD, HTN, HLD presents s/p left TKA.   Clinical Impression   This 75 yo male admitted and underwent above presents to acute OT with all OT education completed with pt and wife, we will D/C from acute OT    Follow Up Recommendations  No OT follow up    Equipment Recommendations  None recommended by OT       Precautions / Restrictions Precautions Precautions: Knee Restrictions Weight Bearing Restrictions: Yes LLE Weight Bearing: Weight bearing as tolerated      Mobility Bed Mobility Overal bed mobility: Modified Independent Bed Mobility: Supine to Sit     Supine to sit: Modified independent (Device/Increase time) (HOB flat and no rail)        Transfers Overall transfer level: Needs assistance Equipment used: Rolling walker (2 wheeled) Transfers: Sit to/from Stand Sit to Stand: Min guard              Balance Overall balance assessment: Needs assistance Sitting-balance support: No upper extremity supported;Feet supported Sitting balance-Leahy Scale: Good     Standing balance support: Bilateral upper extremity supported;During functional activity   Standing balance comment: reliant on RW                            ADL Overall ADL's : Needs assistance/impaired Eating/Feeding: Independent;Sitting   Grooming: Set up;Sitting   Upper Body Bathing: Set up;Sitting   Lower Body Bathing: Set up;Min guard (min guard A sit<>stand)   Upper Body Dressing : Set up;Sitting   Lower Body Dressing: Set up;Supervision/safety (min guard A sit<>stand)   Toilet Transfer: Min guard;Ambulation;RW;Comfort height toilet;Grab bars   Toileting- Clothing Manipulation and Hygiene: Min guard;Sit to/from stand   Tub/ Shower  Transfer: Minimal assistance;Ambulation;Rolling walker;Shower Scientist, research (medical) Details (indicate cue type and reason): stepping over into tub with good leg first then operated leg, turn sit on tub seat. reversed process for getting out of tub                   Pertinent Vitals/Pain Pain Assessment: 0-10 Pain Score: 2  Pain Location: left knee Pain Descriptors / Indicators: Aching;Sore Pain Intervention(s): Monitored during session;Repositioned     Hand Dominance Right   Extremity/Trunk Assessment Upper Extremity Assessment Upper Extremity Assessment: Overall WFL for tasks assessed           Communication Communication Communication: No difficulties   Cognition Arousal/Alertness: Awake/alert Behavior During Therapy: WFL for tasks assessed/performed Overall Cognitive Status: Within Functional Limits for tasks assessed                                Home Living Family/patient expects to be discharged to:: Private residence Living Arrangements: Spouse/significant other Available Help at Discharge: Family;Available 24 hours/day Type of Home: House Home Access: Stairs to enter [employer] of Steps: 2 small steps Entrance Stairs-Rails: None Home Layout: One level     Bathroom Shower/Tub: Tub/shower unit;Door   Bathroom Toilet: Handicapped height     Home Equipment: Environmental consultant - 2 wheels;Bedside commode;Shower seat          Prior Functioning/Environment Level of Independence: Independent  OT Diagnosis: Generalized weakness;Acute pain         OT Goals(Current goals can be found in the care plan section) Acute Rehab OT Goals Patient Stated Goal: to go home today  OT Frequency:                End of Session Equipment Utilized During Treatment: Gait belt;Rolling walker  Activity Tolerance: Patient tolerated treatment well Patient left: in chair;with call bell/phone within reach;with family/visitor present    Time: [id number] OT Time Calculation (min): 37 min Charges:  OT General Charges $OT Visit: 1 Procedure OT Evaluation $OT Eval Moderate Complexity: 1 Procedure OT Treatments $Self Care/Home Management : 8-22 mins  Almon Register [id number] 06/10/2015, 11:05 AM

## 2015-06-10 NOTE — Progress Notes (Signed)
SPORTS MEDICINE AND JOINT REPLACEMENT  Lara Mulch, MD   [facility] [place], [place], [place]  [address]                             [phone]   PROGRESS NOTE  Subjective:  negative for Chest Pain  negative for Shortness of Breath  negative for Nausea/Vomiting   negative for Calf Pain  negative for Bowel Movement   Tolerating Diet: yes         Patient reports pain as 5 on 0-10 scale.    Objective: Vital signs in last 24 hours:   Patient Vitals for the past 24 hrs:  BP Temp Temp src Pulse Resp SpO2  06/10/15 0545 (!) 142/66 mmHg 98.5 F (36.9 C) Oral 80 16 93 %  06/09/15 2359 (!) 142/65 mmHg 98.9 F (37.2 C) Oral 62 15 96 %  06/09/15 2028 140/67 mmHg 98.9 F (37.2 C) Oral 60 15 95 %  06/09/15 1358 117/72 mmHg 97.9 F (36.6 C) Oral (!) 50 15 96 %  06/09/15 1315 - - - (!) 50 12 96 %  06/09/15 1300 116/66 mmHg - - (!) 50 13 96 %  06/09/15 1245 121/67 mmHg - - (!) 54 13 94 %  06/09/15 1230 121/65 mmHg - - (!) 52 17 97 %  06/09/15 1215 124/75 mmHg - - (!) 54 19 96 %  06/09/15 1200 116/66 mmHg - - (!) 53 13 95 %  06/09/15 1149 112/72 mmHg - - (!) 53 13 95 %  06/09/15 1134 113/65 mmHg - - (!) 53 13 96 %  06/09/15 1130 - - - (!) 52 15 96 %  06/09/15 1119 116/67 mmHg - - (!) 53 14 96 %  06/09/15 1104 118/68 mmHg - - (!) 48 13 97 %  06/09/15 1100 118/68 mmHg - - (!) 50 17 97 %  06/09/15 1045 128/65 mmHg - - (!) 50 12 98 %  06/09/15 1033 104/68 mmHg 97.2 F (36.2 C) - (!) 51 17 96 %    @flow {1959:LAST@   Intake/Output from previous day:   06/05 0701 - 06/06 0700 In: 1380 [P.O.:280; I.V.:1000] Out: 820 [Urine:800]   Intake/Output this shift:   06/05 1901 - 06/06 0700 In: 380 [P.O.:280] Out: 300 [Urine:300]   Intake/Output      06/05 0701 - 06/06 0700   P.O. 280   I.V. (mL/kg) 1000 (9.5)   IV Piggyback 100   Total Intake(mL/kg) 1380 (13.1)   Urine (mL/kg/hr) 800 (0.3)   Blood 20 (0)   Total Output 820   Net +560          LABORATORY  DATA:  Recent Labs  06/09/15 1515 06/10/15 0541  WBC 12.7* 10.5  HGB 12.6* 12.3*  HCT 39.5 38.6*  PLT 155 142*    Recent Labs  06/09/15 1515  CREATININE 0.87   Lab Results  Component Value Date   INR 1.00 05/28/2015   INR 1.04 10/15/2009    Examination:  General appearance: alert, cooperative and no distress Extremities: extremities normal, atraumatic, no cyanosis or edema  Wound Exam: clean, dry, intact   Drainage:  None: wound tissue dry  Motor Exam: Quadriceps and Hamstrings Intact  Sensory Exam: Superficial Peroneal, Deep Peroneal and Tibial normal   Assessment:    1 Day Post-Op  Procedure(s) (LRB): TOTAL KNEE ARTHROPLASTY (Left)  ADDITIONAL DIAGNOSIS:  Active Problems:   S/P total knee replacement  Acute Blood Loss Anemia   Plan: Physical Therapy as ordered Weight Bearing as Tolerated (WBAT)  DVT Prophylaxis:  Lovenox  DISCHARGE PLAN: Home  DISCHARGE NEEDS: HHPT   Patient doing great. Will D/C today         Donia Ast 06/10/2015, 6:59 AM

## 2015-06-11 DIAGNOSIS — Z96652 Presence of left artificial knee joint: Secondary | ICD-10-CM | POA: Diagnosis not present

## 2015-06-11 DIAGNOSIS — M1991 Primary osteoarthritis, unspecified site: Secondary | ICD-10-CM | POA: Diagnosis not present

## 2015-06-11 DIAGNOSIS — Z471 Aftercare following joint replacement surgery: Secondary | ICD-10-CM | POA: Diagnosis not present

## 2015-06-11 DIAGNOSIS — I10 Essential (primary) hypertension: Secondary | ICD-10-CM | POA: Diagnosis not present

## 2015-06-11 DIAGNOSIS — I2510 Atherosclerotic heart disease of native coronary artery without angina pectoris: Secondary | ICD-10-CM | POA: Diagnosis not present

## 2015-06-11 DIAGNOSIS — Z87442 Personal history of urinary calculi: Secondary | ICD-10-CM | POA: Diagnosis not present

## 2015-06-11 DIAGNOSIS — G4730 Sleep apnea, unspecified: Secondary | ICD-10-CM | POA: Diagnosis not present

## 2015-06-11 DIAGNOSIS — Z8701 Personal history of pneumonia (recurrent): Secondary | ICD-10-CM | POA: Diagnosis not present

## 2015-06-11 DIAGNOSIS — Z7901 Long term (current) use of anticoagulants: Secondary | ICD-10-CM | POA: Diagnosis not present

## 2015-06-11 DIAGNOSIS — Z79891 Long term (current) use of opiate analgesic: Secondary | ICD-10-CM | POA: Diagnosis not present

## 2015-06-14 DIAGNOSIS — Z96652 Presence of left artificial knee joint: Secondary | ICD-10-CM | POA: Diagnosis not present

## 2015-06-14 DIAGNOSIS — M1991 Primary osteoarthritis, unspecified site: Secondary | ICD-10-CM | POA: Diagnosis not present

## 2015-06-14 DIAGNOSIS — G4730 Sleep apnea, unspecified: Secondary | ICD-10-CM | POA: Diagnosis not present

## 2015-06-14 DIAGNOSIS — Z471 Aftercare following joint replacement surgery: Secondary | ICD-10-CM | POA: Diagnosis not present

## 2015-06-14 DIAGNOSIS — I10 Essential (primary) hypertension: Secondary | ICD-10-CM | POA: Diagnosis not present

## 2015-06-14 DIAGNOSIS — Z8701 Personal history of pneumonia (recurrent): Secondary | ICD-10-CM | POA: Diagnosis not present

## 2015-06-14 DIAGNOSIS — I2510 Atherosclerotic heart disease of native coronary artery without angina pectoris: Secondary | ICD-10-CM | POA: Diagnosis not present

## 2015-06-14 DIAGNOSIS — Z7901 Long term (current) use of anticoagulants: Secondary | ICD-10-CM | POA: Diagnosis not present

## 2015-06-14 DIAGNOSIS — Z79891 Long term (current) use of opiate analgesic: Secondary | ICD-10-CM | POA: Diagnosis not present

## 2015-06-14 DIAGNOSIS — Z87442 Personal history of urinary calculi: Secondary | ICD-10-CM | POA: Diagnosis not present

## 2015-06-23 DIAGNOSIS — Z471 Aftercare following joint replacement surgery: Secondary | ICD-10-CM | POA: Diagnosis not present

## 2015-06-23 DIAGNOSIS — I2510 Atherosclerotic heart disease of native coronary artery without angina pectoris: Secondary | ICD-10-CM | POA: Diagnosis not present

## 2015-06-23 DIAGNOSIS — G4730 Sleep apnea, unspecified: Secondary | ICD-10-CM | POA: Diagnosis not present

## 2015-06-23 DIAGNOSIS — Z96652 Presence of left artificial knee joint: Secondary | ICD-10-CM | POA: Diagnosis not present

## 2015-06-23 DIAGNOSIS — M1991 Primary osteoarthritis, unspecified site: Secondary | ICD-10-CM | POA: Diagnosis not present

## 2015-06-23 DIAGNOSIS — Z7901 Long term (current) use of anticoagulants: Secondary | ICD-10-CM | POA: Diagnosis not present

## 2015-06-23 DIAGNOSIS — Z8701 Personal history of pneumonia (recurrent): Secondary | ICD-10-CM | POA: Diagnosis not present

## 2015-06-23 DIAGNOSIS — I10 Essential (primary) hypertension: Secondary | ICD-10-CM | POA: Diagnosis not present

## 2015-06-23 DIAGNOSIS — Z79891 Long term (current) use of opiate analgesic: Secondary | ICD-10-CM | POA: Diagnosis not present

## 2015-06-23 DIAGNOSIS — Z87442 Personal history of urinary calculi: Secondary | ICD-10-CM | POA: Diagnosis not present

## 2015-06-24 DIAGNOSIS — Z96652 Presence of left artificial knee joint: Secondary | ICD-10-CM | POA: Diagnosis not present

## 2015-06-24 DIAGNOSIS — Z471 Aftercare following joint replacement surgery: Secondary | ICD-10-CM | POA: Diagnosis not present

## 2015-06-25 DIAGNOSIS — R2689 Other abnormalities of gait and mobility: Secondary | ICD-10-CM | POA: Diagnosis not present

## 2015-06-25 DIAGNOSIS — Z96652 Presence of left artificial knee joint: Secondary | ICD-10-CM | POA: Diagnosis not present

## 2015-06-25 DIAGNOSIS — M25462 Effusion, left knee: Secondary | ICD-10-CM | POA: Diagnosis not present

## 2015-06-25 DIAGNOSIS — M25562 Pain in left knee: Secondary | ICD-10-CM | POA: Diagnosis not present

## 2015-06-27 DIAGNOSIS — M25462 Effusion, left knee: Secondary | ICD-10-CM | POA: Diagnosis not present

## 2015-06-27 DIAGNOSIS — Z96652 Presence of left artificial knee joint: Secondary | ICD-10-CM | POA: Diagnosis not present

## 2015-06-27 DIAGNOSIS — M25562 Pain in left knee: Secondary | ICD-10-CM | POA: Diagnosis not present

## 2015-06-27 DIAGNOSIS — R2689 Other abnormalities of gait and mobility: Secondary | ICD-10-CM | POA: Diagnosis not present

## 2015-07-01 DIAGNOSIS — R2689 Other abnormalities of gait and mobility: Secondary | ICD-10-CM | POA: Diagnosis not present

## 2015-07-01 DIAGNOSIS — M25562 Pain in left knee: Secondary | ICD-10-CM | POA: Diagnosis not present

## 2015-07-01 DIAGNOSIS — M25462 Effusion, left knee: Secondary | ICD-10-CM | POA: Diagnosis not present

## 2015-07-01 DIAGNOSIS — Z96652 Presence of left artificial knee joint: Secondary | ICD-10-CM | POA: Diagnosis not present

## 2015-07-04 DIAGNOSIS — R2689 Other abnormalities of gait and mobility: Secondary | ICD-10-CM | POA: Diagnosis not present

## 2015-07-04 DIAGNOSIS — M25462 Effusion, left knee: Secondary | ICD-10-CM | POA: Diagnosis not present

## 2015-07-04 DIAGNOSIS — Z96652 Presence of left artificial knee joint: Secondary | ICD-10-CM | POA: Diagnosis not present

## 2015-07-04 DIAGNOSIS — M25562 Pain in left knee: Secondary | ICD-10-CM | POA: Diagnosis not present

## 2015-07-07 DIAGNOSIS — Z96652 Presence of left artificial knee joint: Secondary | ICD-10-CM | POA: Diagnosis not present

## 2015-07-07 DIAGNOSIS — M25462 Effusion, left knee: Secondary | ICD-10-CM | POA: Diagnosis not present

## 2015-07-07 DIAGNOSIS — R2689 Other abnormalities of gait and mobility: Secondary | ICD-10-CM | POA: Diagnosis not present

## 2015-07-07 DIAGNOSIS — M25562 Pain in left knee: Secondary | ICD-10-CM | POA: Diagnosis not present

## 2015-07-11 DIAGNOSIS — M25462 Effusion, left knee: Secondary | ICD-10-CM | POA: Diagnosis not present

## 2015-07-11 DIAGNOSIS — Z96652 Presence of left artificial knee joint: Secondary | ICD-10-CM | POA: Diagnosis not present

## 2015-07-11 DIAGNOSIS — M25562 Pain in left knee: Secondary | ICD-10-CM | POA: Diagnosis not present

## 2015-07-11 DIAGNOSIS — R2689 Other abnormalities of gait and mobility: Secondary | ICD-10-CM | POA: Diagnosis not present

## 2015-07-15 DIAGNOSIS — M25462 Effusion, left knee: Secondary | ICD-10-CM | POA: Diagnosis not present

## 2015-07-15 DIAGNOSIS — M25562 Pain in left knee: Secondary | ICD-10-CM | POA: Diagnosis not present

## 2015-07-15 DIAGNOSIS — R2689 Other abnormalities of gait and mobility: Secondary | ICD-10-CM | POA: Diagnosis not present

## 2015-07-15 DIAGNOSIS — Z96652 Presence of left artificial knee joint: Secondary | ICD-10-CM | POA: Diagnosis not present

## 2015-07-15 DIAGNOSIS — E538 Deficiency of other specified B group vitamins: Secondary | ICD-10-CM | POA: Diagnosis not present

## 2015-07-18 DIAGNOSIS — M25562 Pain in left knee: Secondary | ICD-10-CM | POA: Diagnosis not present

## 2015-07-18 DIAGNOSIS — Z96652 Presence of left artificial knee joint: Secondary | ICD-10-CM | POA: Diagnosis not present

## 2015-07-18 DIAGNOSIS — R2689 Other abnormalities of gait and mobility: Secondary | ICD-10-CM | POA: Diagnosis not present

## 2015-07-18 DIAGNOSIS — M25462 Effusion, left knee: Secondary | ICD-10-CM | POA: Diagnosis not present

## 2015-07-21 DIAGNOSIS — Z96652 Presence of left artificial knee joint: Secondary | ICD-10-CM | POA: Diagnosis not present

## 2015-07-21 DIAGNOSIS — R2689 Other abnormalities of gait and mobility: Secondary | ICD-10-CM | POA: Diagnosis not present

## 2015-07-21 DIAGNOSIS — M25462 Effusion, left knee: Secondary | ICD-10-CM | POA: Diagnosis not present

## 2015-07-21 DIAGNOSIS — M25562 Pain in left knee: Secondary | ICD-10-CM | POA: Diagnosis not present

## 2015-07-22 DIAGNOSIS — N402 Nodular prostate without lower urinary tract symptoms: Secondary | ICD-10-CM | POA: Diagnosis not present

## 2015-07-23 DIAGNOSIS — R079 Chest pain, unspecified: Secondary | ICD-10-CM | POA: Diagnosis not present

## 2015-07-23 DIAGNOSIS — R0781 Pleurodynia: Secondary | ICD-10-CM | POA: Diagnosis not present

## 2015-07-23 DIAGNOSIS — R0602 Shortness of breath: Secondary | ICD-10-CM | POA: Diagnosis not present

## 2015-07-23 DIAGNOSIS — Z1389 Encounter for screening for other disorder: Secondary | ICD-10-CM | POA: Diagnosis not present

## 2015-07-23 DIAGNOSIS — R600 Localized edema: Secondary | ICD-10-CM | POA: Diagnosis not present

## 2015-07-24 DIAGNOSIS — R079 Chest pain, unspecified: Secondary | ICD-10-CM | POA: Diagnosis not present

## 2015-07-24 DIAGNOSIS — R0602 Shortness of breath: Secondary | ICD-10-CM | POA: Diagnosis not present

## 2015-07-24 DIAGNOSIS — R600 Localized edema: Secondary | ICD-10-CM | POA: Diagnosis not present

## 2015-07-25 DIAGNOSIS — R2689 Other abnormalities of gait and mobility: Secondary | ICD-10-CM | POA: Diagnosis not present

## 2015-07-25 DIAGNOSIS — M25562 Pain in left knee: Secondary | ICD-10-CM | POA: Diagnosis not present

## 2015-07-25 DIAGNOSIS — Z96652 Presence of left artificial knee joint: Secondary | ICD-10-CM | POA: Diagnosis not present

## 2015-07-25 DIAGNOSIS — M25462 Effusion, left knee: Secondary | ICD-10-CM | POA: Diagnosis not present

## 2015-08-05 DIAGNOSIS — H04123 Dry eye syndrome of bilateral lacrimal glands: Secondary | ICD-10-CM | POA: Diagnosis not present

## 2015-08-05 DIAGNOSIS — Z96652 Presence of left artificial knee joint: Secondary | ICD-10-CM | POA: Diagnosis not present

## 2015-08-05 DIAGNOSIS — R2689 Other abnormalities of gait and mobility: Secondary | ICD-10-CM | POA: Diagnosis not present

## 2015-08-05 DIAGNOSIS — M25462 Effusion, left knee: Secondary | ICD-10-CM | POA: Diagnosis not present

## 2015-08-05 DIAGNOSIS — M25562 Pain in left knee: Secondary | ICD-10-CM | POA: Diagnosis not present

## 2015-08-06 DIAGNOSIS — R972 Elevated prostate specific antigen [PSA]: Secondary | ICD-10-CM | POA: Diagnosis not present

## 2015-08-06 DIAGNOSIS — N200 Calculus of kidney: Secondary | ICD-10-CM | POA: Diagnosis not present

## 2015-08-08 DIAGNOSIS — M25462 Effusion, left knee: Secondary | ICD-10-CM | POA: Diagnosis not present

## 2015-08-08 DIAGNOSIS — M25562 Pain in left knee: Secondary | ICD-10-CM | POA: Diagnosis not present

## 2015-08-08 DIAGNOSIS — Z96652 Presence of left artificial knee joint: Secondary | ICD-10-CM | POA: Diagnosis not present

## 2015-08-08 DIAGNOSIS — R2689 Other abnormalities of gait and mobility: Secondary | ICD-10-CM | POA: Diagnosis not present

## 2015-08-12 DIAGNOSIS — G4733 Obstructive sleep apnea (adult) (pediatric): Secondary | ICD-10-CM | POA: Diagnosis not present

## 2015-08-28 DIAGNOSIS — D510 Vitamin B12 deficiency anemia due to intrinsic factor deficiency: Secondary | ICD-10-CM | POA: Diagnosis not present

## 2015-09-18 DIAGNOSIS — M4806 Spinal stenosis, lumbar region: Secondary | ICD-10-CM | POA: Diagnosis not present

## 2015-09-19 DIAGNOSIS — Z471 Aftercare following joint replacement surgery: Secondary | ICD-10-CM | POA: Diagnosis not present

## 2015-09-19 DIAGNOSIS — Z96652 Presence of left artificial knee joint: Secondary | ICD-10-CM | POA: Diagnosis not present

## 2015-10-03 DIAGNOSIS — Z23 Encounter for immunization: Secondary | ICD-10-CM | POA: Diagnosis not present

## 2015-10-03 DIAGNOSIS — E538 Deficiency of other specified B group vitamins: Secondary | ICD-10-CM | POA: Diagnosis not present

## 2015-10-16 DIAGNOSIS — M48061 Spinal stenosis, lumbar region without neurogenic claudication: Secondary | ICD-10-CM | POA: Diagnosis not present

## 2015-11-13 DIAGNOSIS — E538 Deficiency of other specified B group vitamins: Secondary | ICD-10-CM | POA: Diagnosis not present

## 2015-12-11 DIAGNOSIS — E538 Deficiency of other specified B group vitamins: Secondary | ICD-10-CM | POA: Diagnosis not present

## 2015-12-18 DIAGNOSIS — M48061 Spinal stenosis, lumbar region without neurogenic claudication: Secondary | ICD-10-CM | POA: Diagnosis not present

## 2016-01-05 HISTORY — PX: UMBILICAL HERNIA REPAIR: SHX196

## 2016-01-12 DIAGNOSIS — D510 Vitamin B12 deficiency anemia due to intrinsic factor deficiency: Secondary | ICD-10-CM | POA: Diagnosis not present

## 2016-01-16 DIAGNOSIS — G4733 Obstructive sleep apnea (adult) (pediatric): Secondary | ICD-10-CM | POA: Diagnosis not present

## 2016-02-05 DIAGNOSIS — Z6831 Body mass index (BMI) 31.0-31.9, adult: Secondary | ICD-10-CM | POA: Diagnosis not present

## 2016-02-05 DIAGNOSIS — M48061 Spinal stenosis, lumbar region without neurogenic claudication: Secondary | ICD-10-CM | POA: Diagnosis not present

## 2016-02-05 DIAGNOSIS — I10 Essential (primary) hypertension: Secondary | ICD-10-CM | POA: Diagnosis not present

## 2016-02-09 DIAGNOSIS — I10 Essential (primary) hypertension: Secondary | ICD-10-CM | POA: Diagnosis not present

## 2016-02-09 DIAGNOSIS — Z79899 Other long term (current) drug therapy: Secondary | ICD-10-CM | POA: Diagnosis not present

## 2016-02-09 DIAGNOSIS — R1084 Generalized abdominal pain: Secondary | ICD-10-CM | POA: Diagnosis not present

## 2016-02-09 DIAGNOSIS — K219 Gastro-esophageal reflux disease without esophagitis: Secondary | ICD-10-CM | POA: Diagnosis not present

## 2016-02-09 DIAGNOSIS — E039 Hypothyroidism, unspecified: Secondary | ICD-10-CM | POA: Diagnosis not present

## 2016-02-09 DIAGNOSIS — E782 Mixed hyperlipidemia: Secondary | ICD-10-CM | POA: Diagnosis not present

## 2016-02-09 DIAGNOSIS — R9431 Abnormal electrocardiogram [ECG] [EKG]: Secondary | ICD-10-CM | POA: Diagnosis not present

## 2016-02-09 DIAGNOSIS — R7309 Other abnormal glucose: Secondary | ICD-10-CM | POA: Diagnosis not present

## 2016-02-09 DIAGNOSIS — E538 Deficiency of other specified B group vitamins: Secondary | ICD-10-CM | POA: Diagnosis not present

## 2016-02-09 DIAGNOSIS — Z0000 Encounter for general adult medical examination without abnormal findings: Secondary | ICD-10-CM | POA: Diagnosis not present

## 2016-03-04 DIAGNOSIS — N200 Calculus of kidney: Secondary | ICD-10-CM | POA: Diagnosis not present

## 2016-03-04 DIAGNOSIS — R1084 Generalized abdominal pain: Secondary | ICD-10-CM | POA: Diagnosis not present

## 2016-03-04 DIAGNOSIS — I700 Atherosclerosis of aorta: Secondary | ICD-10-CM | POA: Diagnosis not present

## 2016-03-04 DIAGNOSIS — K5730 Diverticulosis of large intestine without perforation or abscess without bleeding: Secondary | ICD-10-CM | POA: Diagnosis not present

## 2016-03-04 DIAGNOSIS — M48061 Spinal stenosis, lumbar region without neurogenic claudication: Secondary | ICD-10-CM | POA: Diagnosis not present

## 2016-03-22 DIAGNOSIS — D510 Vitamin B12 deficiency anemia due to intrinsic factor deficiency: Secondary | ICD-10-CM | POA: Diagnosis not present

## 2016-03-30 DIAGNOSIS — Z96652 Presence of left artificial knee joint: Secondary | ICD-10-CM | POA: Diagnosis not present

## 2016-03-30 DIAGNOSIS — Z471 Aftercare following joint replacement surgery: Secondary | ICD-10-CM | POA: Diagnosis not present

## 2016-03-30 DIAGNOSIS — M25462 Effusion, left knee: Secondary | ICD-10-CM | POA: Diagnosis not present

## 2016-04-12 DIAGNOSIS — M5416 Radiculopathy, lumbar region: Secondary | ICD-10-CM | POA: Diagnosis not present

## 2016-04-12 DIAGNOSIS — M48061 Spinal stenosis, lumbar region without neurogenic claudication: Secondary | ICD-10-CM | POA: Diagnosis not present

## 2016-04-29 DIAGNOSIS — D510 Vitamin B12 deficiency anemia due to intrinsic factor deficiency: Secondary | ICD-10-CM | POA: Diagnosis not present

## 2016-05-07 DIAGNOSIS — B351 Tinea unguium: Secondary | ICD-10-CM | POA: Diagnosis not present

## 2016-05-07 DIAGNOSIS — L570 Actinic keratosis: Secondary | ICD-10-CM | POA: Diagnosis not present

## 2016-05-27 DIAGNOSIS — D510 Vitamin B12 deficiency anemia due to intrinsic factor deficiency: Secondary | ICD-10-CM | POA: Diagnosis not present

## 2016-06-01 DIAGNOSIS — M48061 Spinal stenosis, lumbar region without neurogenic claudication: Secondary | ICD-10-CM | POA: Diagnosis not present

## 2016-06-01 DIAGNOSIS — I10 Essential (primary) hypertension: Secondary | ICD-10-CM | POA: Diagnosis not present

## 2016-06-01 DIAGNOSIS — Z6831 Body mass index (BMI) 31.0-31.9, adult: Secondary | ICD-10-CM | POA: Diagnosis not present

## 2016-06-01 DIAGNOSIS — M5416 Radiculopathy, lumbar region: Secondary | ICD-10-CM | POA: Diagnosis not present

## 2016-06-24 DIAGNOSIS — D510 Vitamin B12 deficiency anemia due to intrinsic factor deficiency: Secondary | ICD-10-CM | POA: Diagnosis not present

## 2016-07-09 DIAGNOSIS — B351 Tinea unguium: Secondary | ICD-10-CM | POA: Diagnosis not present

## 2016-07-13 DIAGNOSIS — M48061 Spinal stenosis, lumbar region without neurogenic claudication: Secondary | ICD-10-CM | POA: Diagnosis not present

## 2016-07-22 DIAGNOSIS — D510 Vitamin B12 deficiency anemia due to intrinsic factor deficiency: Secondary | ICD-10-CM | POA: Diagnosis not present

## 2016-08-11 DIAGNOSIS — M5416 Radiculopathy, lumbar region: Secondary | ICD-10-CM | POA: Diagnosis not present

## 2016-08-11 DIAGNOSIS — M48061 Spinal stenosis, lumbar region without neurogenic claudication: Secondary | ICD-10-CM | POA: Diagnosis not present

## 2016-08-11 DIAGNOSIS — I10 Essential (primary) hypertension: Secondary | ICD-10-CM | POA: Diagnosis not present

## 2016-08-11 DIAGNOSIS — Z6830 Body mass index (BMI) 30.0-30.9, adult: Secondary | ICD-10-CM | POA: Diagnosis not present

## 2016-09-16 DIAGNOSIS — D510 Vitamin B12 deficiency anemia due to intrinsic factor deficiency: Secondary | ICD-10-CM | POA: Diagnosis not present

## 2016-09-22 DIAGNOSIS — N403 Nodular prostate with lower urinary tract symptoms: Secondary | ICD-10-CM | POA: Diagnosis not present

## 2016-09-29 DIAGNOSIS — N200 Calculus of kidney: Secondary | ICD-10-CM | POA: Diagnosis not present

## 2016-09-29 DIAGNOSIS — N401 Enlarged prostate with lower urinary tract symptoms: Secondary | ICD-10-CM | POA: Diagnosis not present

## 2016-09-29 DIAGNOSIS — R3912 Poor urinary stream: Secondary | ICD-10-CM | POA: Diagnosis not present

## 2016-09-29 DIAGNOSIS — R972 Elevated prostate specific antigen [PSA]: Secondary | ICD-10-CM | POA: Diagnosis not present

## 2016-10-14 DIAGNOSIS — J0111 Acute recurrent frontal sinusitis: Secondary | ICD-10-CM | POA: Diagnosis not present

## 2016-10-14 DIAGNOSIS — E663 Overweight: Secondary | ICD-10-CM | POA: Diagnosis not present

## 2016-10-14 DIAGNOSIS — J40 Bronchitis, not specified as acute or chronic: Secondary | ICD-10-CM | POA: Diagnosis not present

## 2016-10-14 DIAGNOSIS — Z6830 Body mass index (BMI) 30.0-30.9, adult: Secondary | ICD-10-CM | POA: Diagnosis not present

## 2016-10-26 DIAGNOSIS — M48061 Spinal stenosis, lumbar region without neurogenic claudication: Secondary | ICD-10-CM | POA: Diagnosis not present

## 2016-10-26 DIAGNOSIS — Z23 Encounter for immunization: Secondary | ICD-10-CM | POA: Diagnosis not present

## 2016-11-11 DIAGNOSIS — D510 Vitamin B12 deficiency anemia due to intrinsic factor deficiency: Secondary | ICD-10-CM | POA: Diagnosis not present

## 2016-12-09 DIAGNOSIS — D510 Vitamin B12 deficiency anemia due to intrinsic factor deficiency: Secondary | ICD-10-CM | POA: Diagnosis not present

## 2016-12-17 DIAGNOSIS — M5416 Radiculopathy, lumbar region: Secondary | ICD-10-CM | POA: Diagnosis not present

## 2016-12-17 DIAGNOSIS — M48061 Spinal stenosis, lumbar region without neurogenic claudication: Secondary | ICD-10-CM | POA: Diagnosis not present

## 2016-12-17 DIAGNOSIS — M4726 Other spondylosis with radiculopathy, lumbar region: Secondary | ICD-10-CM | POA: Diagnosis not present

## 2016-12-24 DIAGNOSIS — Z9181 History of falling: Secondary | ICD-10-CM | POA: Diagnosis not present

## 2016-12-24 DIAGNOSIS — F332 Major depressive disorder, recurrent severe without psychotic features: Secondary | ICD-10-CM | POA: Diagnosis not present

## 2016-12-24 DIAGNOSIS — Z1331 Encounter for screening for depression: Secondary | ICD-10-CM | POA: Diagnosis not present

## 2016-12-24 DIAGNOSIS — Z1339 Encounter for screening examination for other mental health and behavioral disorders: Secondary | ICD-10-CM | POA: Diagnosis not present

## 2016-12-24 DIAGNOSIS — J309 Allergic rhinitis, unspecified: Secondary | ICD-10-CM | POA: Diagnosis not present

## 2016-12-24 DIAGNOSIS — K589 Irritable bowel syndrome without diarrhea: Secondary | ICD-10-CM | POA: Diagnosis not present

## 2017-01-06 DIAGNOSIS — D510 Vitamin B12 deficiency anemia due to intrinsic factor deficiency: Secondary | ICD-10-CM | POA: Diagnosis not present

## 2017-01-19 DIAGNOSIS — I201 Angina pectoris with documented spasm: Secondary | ICD-10-CM | POA: Diagnosis not present

## 2017-01-19 DIAGNOSIS — H6981 Other specified disorders of Eustachian tube, right ear: Secondary | ICD-10-CM | POA: Diagnosis not present

## 2017-01-19 DIAGNOSIS — F332 Major depressive disorder, recurrent severe without psychotic features: Secondary | ICD-10-CM | POA: Diagnosis not present

## 2017-01-27 DIAGNOSIS — M5416 Radiculopathy, lumbar region: Secondary | ICD-10-CM | POA: Diagnosis not present

## 2017-01-27 DIAGNOSIS — M48061 Spinal stenosis, lumbar region without neurogenic claudication: Secondary | ICD-10-CM | POA: Diagnosis not present

## 2017-02-03 DIAGNOSIS — D510 Vitamin B12 deficiency anemia due to intrinsic factor deficiency: Secondary | ICD-10-CM | POA: Diagnosis not present

## 2017-02-11 DIAGNOSIS — I201 Angina pectoris with documented spasm: Secondary | ICD-10-CM | POA: Diagnosis not present

## 2017-02-11 DIAGNOSIS — D510 Vitamin B12 deficiency anemia due to intrinsic factor deficiency: Secondary | ICD-10-CM | POA: Diagnosis not present

## 2017-02-11 DIAGNOSIS — E782 Mixed hyperlipidemia: Secondary | ICD-10-CM | POA: Diagnosis not present

## 2017-02-11 DIAGNOSIS — E039 Hypothyroidism, unspecified: Secondary | ICD-10-CM | POA: Diagnosis not present

## 2017-02-11 DIAGNOSIS — Z125 Encounter for screening for malignant neoplasm of prostate: Secondary | ICD-10-CM | POA: Diagnosis not present

## 2017-02-11 DIAGNOSIS — Z6830 Body mass index (BMI) 30.0-30.9, adult: Secondary | ICD-10-CM | POA: Diagnosis not present

## 2017-02-11 DIAGNOSIS — Z79899 Other long term (current) drug therapy: Secondary | ICD-10-CM | POA: Diagnosis not present

## 2017-02-11 DIAGNOSIS — E785 Hyperlipidemia, unspecified: Secondary | ICD-10-CM | POA: Diagnosis not present

## 2017-02-11 DIAGNOSIS — Z0000 Encounter for general adult medical examination without abnormal findings: Secondary | ICD-10-CM | POA: Diagnosis not present

## 2017-02-11 DIAGNOSIS — K589 Irritable bowel syndrome without diarrhea: Secondary | ICD-10-CM | POA: Diagnosis not present

## 2017-03-03 DIAGNOSIS — D510 Vitamin B12 deficiency anemia due to intrinsic factor deficiency: Secondary | ICD-10-CM | POA: Diagnosis not present

## 2017-03-03 DIAGNOSIS — M5416 Radiculopathy, lumbar region: Secondary | ICD-10-CM | POA: Diagnosis not present

## 2017-03-03 DIAGNOSIS — M48061 Spinal stenosis, lumbar region without neurogenic claudication: Secondary | ICD-10-CM | POA: Diagnosis not present

## 2017-03-31 DIAGNOSIS — D510 Vitamin B12 deficiency anemia due to intrinsic factor deficiency: Secondary | ICD-10-CM | POA: Diagnosis not present

## 2017-04-11 DIAGNOSIS — R310 Gross hematuria: Secondary | ICD-10-CM | POA: Diagnosis not present

## 2017-04-11 DIAGNOSIS — N200 Calculus of kidney: Secondary | ICD-10-CM | POA: Diagnosis not present

## 2017-04-11 DIAGNOSIS — N281 Cyst of kidney, acquired: Secondary | ICD-10-CM | POA: Diagnosis not present

## 2017-04-11 DIAGNOSIS — R3129 Other microscopic hematuria: Secondary | ICD-10-CM | POA: Diagnosis not present

## 2017-04-21 DIAGNOSIS — M48061 Spinal stenosis, lumbar region without neurogenic claudication: Secondary | ICD-10-CM | POA: Diagnosis not present

## 2017-05-05 DIAGNOSIS — D510 Vitamin B12 deficiency anemia due to intrinsic factor deficiency: Secondary | ICD-10-CM | POA: Diagnosis not present

## 2017-05-05 DIAGNOSIS — N401 Enlarged prostate with lower urinary tract symptoms: Secondary | ICD-10-CM | POA: Diagnosis not present

## 2017-05-05 DIAGNOSIS — R972 Elevated prostate specific antigen [PSA]: Secondary | ICD-10-CM | POA: Diagnosis not present

## 2017-05-05 DIAGNOSIS — N200 Calculus of kidney: Secondary | ICD-10-CM | POA: Diagnosis not present

## 2017-05-05 DIAGNOSIS — R310 Gross hematuria: Secondary | ICD-10-CM | POA: Diagnosis not present

## 2017-06-02 DIAGNOSIS — D510 Vitamin B12 deficiency anemia due to intrinsic factor deficiency: Secondary | ICD-10-CM | POA: Diagnosis not present

## 2017-06-03 DIAGNOSIS — Z6830 Body mass index (BMI) 30.0-30.9, adult: Secondary | ICD-10-CM | POA: Diagnosis not present

## 2017-06-03 DIAGNOSIS — M5416 Radiculopathy, lumbar region: Secondary | ICD-10-CM | POA: Diagnosis not present

## 2017-06-03 DIAGNOSIS — M48061 Spinal stenosis, lumbar region without neurogenic claudication: Secondary | ICD-10-CM | POA: Diagnosis not present

## 2017-06-03 DIAGNOSIS — M4726 Other spondylosis with radiculopathy, lumbar region: Secondary | ICD-10-CM | POA: Diagnosis not present

## 2017-06-23 DIAGNOSIS — Z6830 Body mass index (BMI) 30.0-30.9, adult: Secondary | ICD-10-CM | POA: Diagnosis not present

## 2017-06-23 DIAGNOSIS — J189 Pneumonia, unspecified organism: Secondary | ICD-10-CM | POA: Diagnosis not present

## 2017-07-06 DIAGNOSIS — D510 Vitamin B12 deficiency anemia due to intrinsic factor deficiency: Secondary | ICD-10-CM | POA: Diagnosis not present

## 2017-07-11 DIAGNOSIS — M5416 Radiculopathy, lumbar region: Secondary | ICD-10-CM | POA: Diagnosis not present

## 2017-08-25 DIAGNOSIS — M5416 Radiculopathy, lumbar region: Secondary | ICD-10-CM | POA: Diagnosis not present

## 2017-08-25 DIAGNOSIS — M48061 Spinal stenosis, lumbar region without neurogenic claudication: Secondary | ICD-10-CM | POA: Diagnosis not present

## 2017-09-02 DIAGNOSIS — D510 Vitamin B12 deficiency anemia due to intrinsic factor deficiency: Secondary | ICD-10-CM | POA: Diagnosis not present

## 2017-09-30 DIAGNOSIS — D510 Vitamin B12 deficiency anemia due to intrinsic factor deficiency: Secondary | ICD-10-CM | POA: Diagnosis not present

## 2017-10-03 DIAGNOSIS — M48061 Spinal stenosis, lumbar region without neurogenic claudication: Secondary | ICD-10-CM | POA: Diagnosis not present

## 2017-10-05 DIAGNOSIS — Z23 Encounter for immunization: Secondary | ICD-10-CM | POA: Diagnosis not present

## 2017-10-28 DIAGNOSIS — D510 Vitamin B12 deficiency anemia due to intrinsic factor deficiency: Secondary | ICD-10-CM | POA: Diagnosis not present

## 2017-10-31 DIAGNOSIS — R972 Elevated prostate specific antigen [PSA]: Secondary | ICD-10-CM | POA: Diagnosis not present

## 2017-11-07 DIAGNOSIS — N401 Enlarged prostate with lower urinary tract symptoms: Secondary | ICD-10-CM | POA: Diagnosis not present

## 2017-11-07 DIAGNOSIS — N200 Calculus of kidney: Secondary | ICD-10-CM | POA: Diagnosis not present

## 2017-11-07 DIAGNOSIS — R3912 Poor urinary stream: Secondary | ICD-10-CM | POA: Diagnosis not present

## 2017-11-07 DIAGNOSIS — M5416 Radiculopathy, lumbar region: Secondary | ICD-10-CM | POA: Diagnosis not present

## 2017-11-07 DIAGNOSIS — Z6831 Body mass index (BMI) 31.0-31.9, adult: Secondary | ICD-10-CM | POA: Diagnosis not present

## 2017-11-07 DIAGNOSIS — M48061 Spinal stenosis, lumbar region without neurogenic claudication: Secondary | ICD-10-CM | POA: Diagnosis not present

## 2017-11-07 DIAGNOSIS — R972 Elevated prostate specific antigen [PSA]: Secondary | ICD-10-CM | POA: Diagnosis not present

## 2017-11-07 DIAGNOSIS — I10 Essential (primary) hypertension: Secondary | ICD-10-CM | POA: Diagnosis not present

## 2017-11-09 DIAGNOSIS — K219 Gastro-esophageal reflux disease without esophagitis: Secondary | ICD-10-CM | POA: Diagnosis not present

## 2017-11-09 DIAGNOSIS — K5732 Diverticulitis of large intestine without perforation or abscess without bleeding: Secondary | ICD-10-CM | POA: Diagnosis not present

## 2017-11-25 DIAGNOSIS — D510 Vitamin B12 deficiency anemia due to intrinsic factor deficiency: Secondary | ICD-10-CM | POA: Diagnosis not present

## 2017-12-06 DIAGNOSIS — M1990 Unspecified osteoarthritis, unspecified site: Secondary | ICD-10-CM | POA: Diagnosis not present

## 2017-12-06 DIAGNOSIS — K644 Residual hemorrhoidal skin tags: Secondary | ICD-10-CM | POA: Diagnosis not present

## 2017-12-06 DIAGNOSIS — K229 Disease of esophagus, unspecified: Secondary | ICD-10-CM | POA: Diagnosis not present

## 2017-12-06 DIAGNOSIS — D126 Benign neoplasm of colon, unspecified: Secondary | ICD-10-CM | POA: Diagnosis not present

## 2017-12-06 DIAGNOSIS — D122 Benign neoplasm of ascending colon: Secondary | ICD-10-CM | POA: Diagnosis not present

## 2017-12-06 DIAGNOSIS — K222 Esophageal obstruction: Secondary | ICD-10-CM | POA: Diagnosis not present

## 2017-12-06 DIAGNOSIS — K635 Polyp of colon: Secondary | ICD-10-CM | POA: Diagnosis not present

## 2017-12-06 DIAGNOSIS — K2270 Barrett's esophagus without dysplasia: Secondary | ICD-10-CM | POA: Diagnosis not present

## 2017-12-06 DIAGNOSIS — I10 Essential (primary) hypertension: Secondary | ICD-10-CM | POA: Diagnosis not present

## 2017-12-06 DIAGNOSIS — K210 Gastro-esophageal reflux disease with esophagitis: Secondary | ICD-10-CM | POA: Diagnosis not present

## 2017-12-06 DIAGNOSIS — K648 Other hemorrhoids: Secondary | ICD-10-CM | POA: Diagnosis not present

## 2017-12-06 DIAGNOSIS — Z86010 Personal history of colonic polyps: Secondary | ICD-10-CM | POA: Diagnosis not present

## 2017-12-06 DIAGNOSIS — Z1211 Encounter for screening for malignant neoplasm of colon: Secondary | ICD-10-CM | POA: Diagnosis not present

## 2017-12-06 DIAGNOSIS — K5730 Diverticulosis of large intestine without perforation or abscess without bleeding: Secondary | ICD-10-CM | POA: Diagnosis not present

## 2017-12-06 DIAGNOSIS — E079 Disorder of thyroid, unspecified: Secondary | ICD-10-CM | POA: Diagnosis not present

## 2017-12-06 DIAGNOSIS — Z79899 Other long term (current) drug therapy: Secondary | ICD-10-CM | POA: Diagnosis not present

## 2017-12-06 DIAGNOSIS — K219 Gastro-esophageal reflux disease without esophagitis: Secondary | ICD-10-CM | POA: Diagnosis not present

## 2017-12-06 DIAGNOSIS — F329 Major depressive disorder, single episode, unspecified: Secondary | ICD-10-CM | POA: Diagnosis not present

## 2017-12-06 DIAGNOSIS — K449 Diaphragmatic hernia without obstruction or gangrene: Secondary | ICD-10-CM | POA: Diagnosis not present

## 2017-12-06 DIAGNOSIS — D124 Benign neoplasm of descending colon: Secondary | ICD-10-CM | POA: Diagnosis not present

## 2017-12-06 DIAGNOSIS — Z800 Family history of malignant neoplasm of digestive organs: Secondary | ICD-10-CM | POA: Diagnosis not present

## 2017-12-06 DIAGNOSIS — E7800 Pure hypercholesterolemia, unspecified: Secondary | ICD-10-CM | POA: Diagnosis not present

## 2017-12-06 DIAGNOSIS — R1310 Dysphagia, unspecified: Secondary | ICD-10-CM | POA: Diagnosis not present

## 2018-01-09 DIAGNOSIS — M48061 Spinal stenosis, lumbar region without neurogenic claudication: Secondary | ICD-10-CM | POA: Diagnosis not present

## 2018-01-12 DIAGNOSIS — R1310 Dysphagia, unspecified: Secondary | ICD-10-CM | POA: Diagnosis not present

## 2018-01-12 DIAGNOSIS — K219 Gastro-esophageal reflux disease without esophagitis: Secondary | ICD-10-CM | POA: Diagnosis not present

## 2018-01-12 DIAGNOSIS — K222 Esophageal obstruction: Secondary | ICD-10-CM | POA: Diagnosis not present

## 2018-01-20 DIAGNOSIS — D510 Vitamin B12 deficiency anemia due to intrinsic factor deficiency: Secondary | ICD-10-CM | POA: Diagnosis not present

## 2018-02-13 DIAGNOSIS — M48061 Spinal stenosis, lumbar region without neurogenic claudication: Secondary | ICD-10-CM | POA: Diagnosis not present

## 2018-02-13 DIAGNOSIS — M5416 Radiculopathy, lumbar region: Secondary | ICD-10-CM | POA: Diagnosis not present

## 2018-02-13 DIAGNOSIS — I10 Essential (primary) hypertension: Secondary | ICD-10-CM | POA: Diagnosis not present

## 2018-02-13 DIAGNOSIS — Z6833 Body mass index (BMI) 33.0-33.9, adult: Secondary | ICD-10-CM | POA: Diagnosis not present

## 2018-02-14 DIAGNOSIS — D510 Vitamin B12 deficiency anemia due to intrinsic factor deficiency: Secondary | ICD-10-CM | POA: Diagnosis not present

## 2018-02-14 DIAGNOSIS — R8281 Pyuria: Secondary | ICD-10-CM | POA: Diagnosis not present

## 2018-02-14 DIAGNOSIS — Z0000 Encounter for general adult medical examination without abnormal findings: Secondary | ICD-10-CM | POA: Diagnosis not present

## 2018-02-14 DIAGNOSIS — Z1331 Encounter for screening for depression: Secondary | ICD-10-CM | POA: Diagnosis not present

## 2018-02-14 DIAGNOSIS — M48061 Spinal stenosis, lumbar region without neurogenic claudication: Secondary | ICD-10-CM | POA: Diagnosis not present

## 2018-02-14 DIAGNOSIS — Y758 Miscellaneous neurological devices associated with adverse incidents, not elsewhere classified: Secondary | ICD-10-CM | POA: Diagnosis not present

## 2018-02-14 DIAGNOSIS — I10 Essential (primary) hypertension: Secondary | ICD-10-CM | POA: Diagnosis not present

## 2018-02-14 DIAGNOSIS — E039 Hypothyroidism, unspecified: Secondary | ICD-10-CM | POA: Diagnosis not present

## 2018-02-14 DIAGNOSIS — E782 Mixed hyperlipidemia: Secondary | ICD-10-CM | POA: Diagnosis not present

## 2018-02-14 DIAGNOSIS — F320 Major depressive disorder, single episode, mild: Secondary | ICD-10-CM | POA: Diagnosis not present

## 2018-02-14 DIAGNOSIS — Z79899 Other long term (current) drug therapy: Secondary | ICD-10-CM | POA: Diagnosis not present

## 2018-02-17 DIAGNOSIS — D510 Vitamin B12 deficiency anemia due to intrinsic factor deficiency: Secondary | ICD-10-CM | POA: Diagnosis not present

## 2018-02-28 IMAGING — CR DG CHEST 2V
2 series · 2 of 2 positions shown · non-contrast
Comparison: 03/03/2008.

CLINICAL DATA: Recent history of pneumonia. Total knee replacement.

EXAM:
CHEST  2 VIEW

[w chest pa]
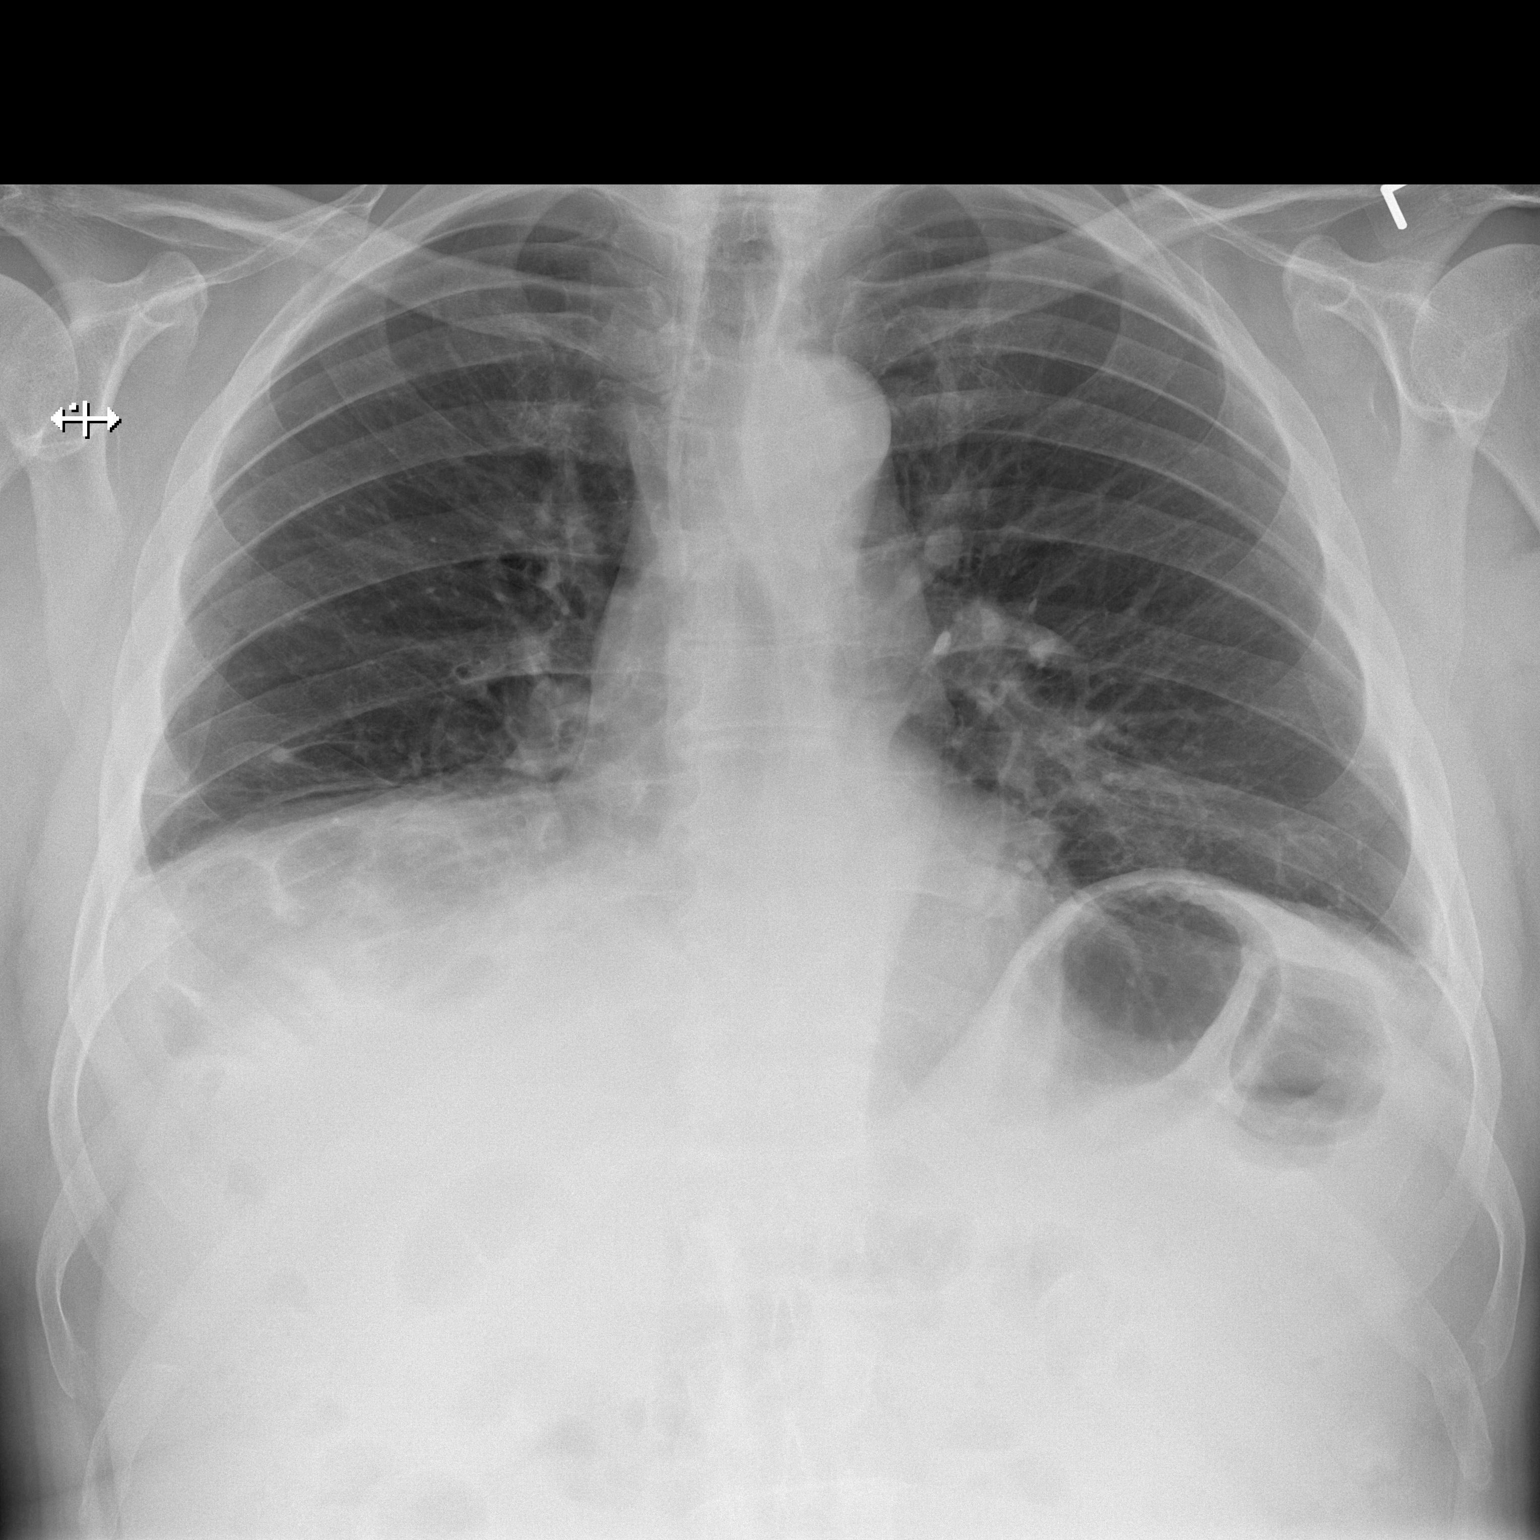

[w chest lat]
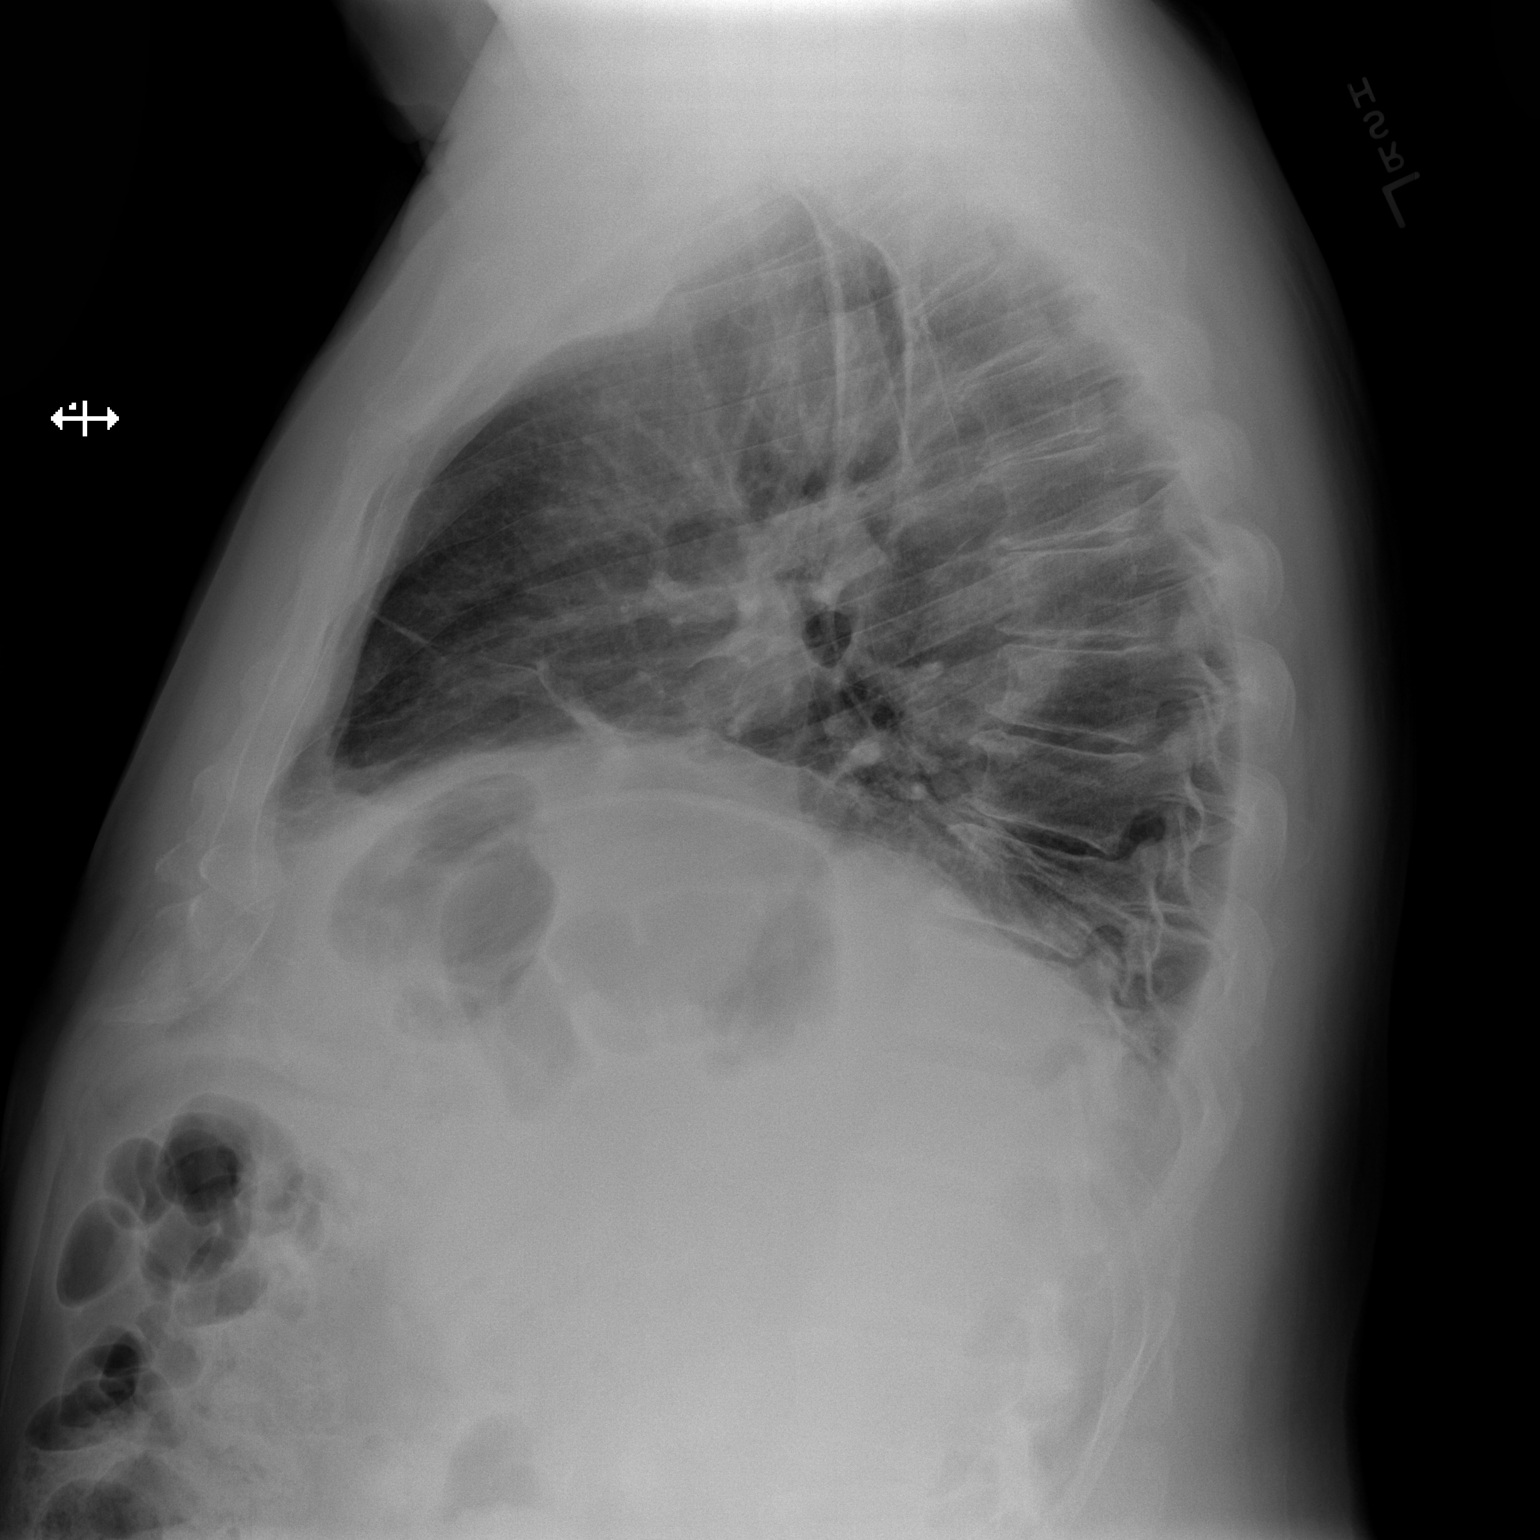

[2 of 2 positions shown; findings below may reference images not displayed]

FINDINGS: Mediastinum hilar structures normal. Low lung volumes with bibasilar
atelectasis and/or infiltrates. Similar findings noted on prior
exam. These changes may be would also related scarring. Tiny
bilateral pleural effusions versus pleural scarring again noted. No
pneumothorax. Heart size normal. Degenerative changes thoracic spine
IMPRESSION: 1. Low lung volumes with mild bibasilar atelectasis and/or
infiltrates. Similar findings noted on prior exam. These changes may
also be related to scarring.

2. Tiny bilateral pleural effusions versus pleural scarring again
noted.

## 2018-04-10 DIAGNOSIS — I10 Essential (primary) hypertension: Secondary | ICD-10-CM | POA: Diagnosis not present

## 2018-04-10 DIAGNOSIS — Z1211 Encounter for screening for malignant neoplasm of colon: Secondary | ICD-10-CM | POA: Diagnosis not present

## 2018-04-10 DIAGNOSIS — I2510 Atherosclerotic heart disease of native coronary artery without angina pectoris: Secondary | ICD-10-CM | POA: Diagnosis not present

## 2018-04-10 DIAGNOSIS — Z1231 Encounter for screening mammogram for malignant neoplasm of breast: Secondary | ICD-10-CM | POA: Diagnosis not present

## 2018-04-10 DIAGNOSIS — J45901 Unspecified asthma with (acute) exacerbation: Secondary | ICD-10-CM | POA: Diagnosis not present

## 2018-04-10 DIAGNOSIS — E7800 Pure hypercholesterolemia, unspecified: Secondary | ICD-10-CM | POA: Diagnosis not present

## 2018-04-10 DIAGNOSIS — Z79899 Other long term (current) drug therapy: Secondary | ICD-10-CM | POA: Diagnosis not present

## 2018-04-10 DIAGNOSIS — I700 Atherosclerosis of aorta: Secondary | ICD-10-CM | POA: Diagnosis not present

## 2018-04-10 DIAGNOSIS — M48061 Spinal stenosis, lumbar region without neurogenic claudication: Secondary | ICD-10-CM | POA: Diagnosis not present

## 2018-04-10 DIAGNOSIS — Z0000 Encounter for general adult medical examination without abnormal findings: Secondary | ICD-10-CM | POA: Diagnosis not present

## 2018-05-04 DIAGNOSIS — M48061 Spinal stenosis, lumbar region without neurogenic claudication: Secondary | ICD-10-CM | POA: Diagnosis not present

## 2018-05-04 DIAGNOSIS — I10 Essential (primary) hypertension: Secondary | ICD-10-CM | POA: Diagnosis not present

## 2018-05-08 DIAGNOSIS — R972 Elevated prostate specific antigen [PSA]: Secondary | ICD-10-CM | POA: Diagnosis not present

## 2018-05-08 DIAGNOSIS — N200 Calculus of kidney: Secondary | ICD-10-CM | POA: Diagnosis not present

## 2018-05-11 DIAGNOSIS — M48061 Spinal stenosis, lumbar region without neurogenic claudication: Secondary | ICD-10-CM | POA: Diagnosis not present

## 2018-06-03 DIAGNOSIS — I10 Essential (primary) hypertension: Secondary | ICD-10-CM | POA: Diagnosis not present

## 2018-06-03 DIAGNOSIS — E039 Hypothyroidism, unspecified: Secondary | ICD-10-CM | POA: Diagnosis not present

## 2018-07-04 DIAGNOSIS — I10 Essential (primary) hypertension: Secondary | ICD-10-CM | POA: Diagnosis not present

## 2018-07-04 DIAGNOSIS — E039 Hypothyroidism, unspecified: Secondary | ICD-10-CM | POA: Diagnosis not present

## 2018-07-11 DIAGNOSIS — M48061 Spinal stenosis, lumbar region without neurogenic claudication: Secondary | ICD-10-CM | POA: Diagnosis not present

## 2018-08-04 DIAGNOSIS — M48061 Spinal stenosis, lumbar region without neurogenic claudication: Secondary | ICD-10-CM | POA: Diagnosis not present

## 2018-08-04 DIAGNOSIS — M5416 Radiculopathy, lumbar region: Secondary | ICD-10-CM | POA: Diagnosis not present

## 2018-09-04 DIAGNOSIS — E782 Mixed hyperlipidemia: Secondary | ICD-10-CM | POA: Diagnosis not present

## 2018-09-04 DIAGNOSIS — I10 Essential (primary) hypertension: Secondary | ICD-10-CM | POA: Diagnosis not present

## 2018-09-06 DIAGNOSIS — J029 Acute pharyngitis, unspecified: Secondary | ICD-10-CM | POA: Diagnosis not present

## 2018-09-06 DIAGNOSIS — Z6832 Body mass index (BMI) 32.0-32.9, adult: Secondary | ICD-10-CM | POA: Diagnosis not present

## 2018-10-12 DIAGNOSIS — M48061 Spinal stenosis, lumbar region without neurogenic claudication: Secondary | ICD-10-CM | POA: Diagnosis not present

## 2018-10-17 DIAGNOSIS — Z23 Encounter for immunization: Secondary | ICD-10-CM | POA: Diagnosis not present

## 2018-10-23 DIAGNOSIS — J029 Acute pharyngitis, unspecified: Secondary | ICD-10-CM | POA: Diagnosis not present

## 2018-10-23 DIAGNOSIS — Z6831 Body mass index (BMI) 31.0-31.9, adult: Secondary | ICD-10-CM | POA: Diagnosis not present

## 2018-11-01 DIAGNOSIS — R972 Elevated prostate specific antigen [PSA]: Secondary | ICD-10-CM | POA: Diagnosis not present

## 2018-11-02 DIAGNOSIS — R509 Fever, unspecified: Secondary | ICD-10-CM | POA: Diagnosis not present

## 2018-11-02 DIAGNOSIS — R519 Headache, unspecified: Secondary | ICD-10-CM | POA: Diagnosis not present

## 2018-11-02 DIAGNOSIS — Z20828 Contact with and (suspected) exposure to other viral communicable diseases: Secondary | ICD-10-CM | POA: Diagnosis not present

## 2018-11-04 DIAGNOSIS — D519 Vitamin B12 deficiency anemia, unspecified: Secondary | ICD-10-CM | POA: Diagnosis not present

## 2018-11-04 DIAGNOSIS — E039 Hypothyroidism, unspecified: Secondary | ICD-10-CM | POA: Diagnosis not present

## 2018-11-04 DIAGNOSIS — I10 Essential (primary) hypertension: Secondary | ICD-10-CM | POA: Diagnosis not present

## 2018-11-09 DIAGNOSIS — M48061 Spinal stenosis, lumbar region without neurogenic claudication: Secondary | ICD-10-CM | POA: Diagnosis not present

## 2018-11-23 DIAGNOSIS — E039 Hypothyroidism, unspecified: Secondary | ICD-10-CM | POA: Diagnosis not present

## 2018-11-23 DIAGNOSIS — Z79899 Other long term (current) drug therapy: Secondary | ICD-10-CM | POA: Diagnosis not present

## 2018-11-23 DIAGNOSIS — M48061 Spinal stenosis, lumbar region without neurogenic claudication: Secondary | ICD-10-CM | POA: Diagnosis not present

## 2018-11-23 DIAGNOSIS — E782 Mixed hyperlipidemia: Secondary | ICD-10-CM | POA: Diagnosis not present

## 2018-11-23 DIAGNOSIS — D519 Vitamin B12 deficiency anemia, unspecified: Secondary | ICD-10-CM | POA: Diagnosis not present

## 2018-12-04 DIAGNOSIS — E782 Mixed hyperlipidemia: Secondary | ICD-10-CM | POA: Diagnosis not present

## 2018-12-04 DIAGNOSIS — D519 Vitamin B12 deficiency anemia, unspecified: Secondary | ICD-10-CM | POA: Diagnosis not present

## 2018-12-04 DIAGNOSIS — E039 Hypothyroidism, unspecified: Secondary | ICD-10-CM | POA: Diagnosis not present

## 2018-12-06 DIAGNOSIS — R1310 Dysphagia, unspecified: Secondary | ICD-10-CM | POA: Diagnosis not present

## 2018-12-06 DIAGNOSIS — D649 Anemia, unspecified: Secondary | ICD-10-CM | POA: Diagnosis not present

## 2018-12-06 DIAGNOSIS — K2270 Barrett's esophagus without dysplasia: Secondary | ICD-10-CM | POA: Diagnosis not present

## 2018-12-19 DIAGNOSIS — K2970 Gastritis, unspecified, without bleeding: Secondary | ICD-10-CM | POA: Diagnosis not present

## 2018-12-19 DIAGNOSIS — K219 Gastro-esophageal reflux disease without esophagitis: Secondary | ICD-10-CM | POA: Diagnosis not present

## 2018-12-19 DIAGNOSIS — K222 Esophageal obstruction: Secondary | ICD-10-CM | POA: Diagnosis not present

## 2018-12-19 DIAGNOSIS — K2950 Unspecified chronic gastritis without bleeding: Secondary | ICD-10-CM | POA: Diagnosis not present

## 2018-12-19 DIAGNOSIS — Z86010 Personal history of colonic polyps: Secondary | ICD-10-CM | POA: Diagnosis not present

## 2018-12-19 DIAGNOSIS — K2270 Barrett's esophagus without dysplasia: Secondary | ICD-10-CM | POA: Diagnosis not present

## 2018-12-19 DIAGNOSIS — D122 Benign neoplasm of ascending colon: Secondary | ICD-10-CM | POA: Diagnosis not present

## 2018-12-19 DIAGNOSIS — K635 Polyp of colon: Secondary | ICD-10-CM | POA: Diagnosis not present

## 2018-12-19 DIAGNOSIS — K5730 Diverticulosis of large intestine without perforation or abscess without bleeding: Secondary | ICD-10-CM | POA: Diagnosis not present

## 2018-12-19 DIAGNOSIS — K648 Other hemorrhoids: Secondary | ICD-10-CM | POA: Diagnosis not present

## 2018-12-19 DIAGNOSIS — R1310 Dysphagia, unspecified: Secondary | ICD-10-CM | POA: Diagnosis not present

## 2018-12-19 DIAGNOSIS — K229 Disease of esophagus, unspecified: Secondary | ICD-10-CM | POA: Diagnosis not present

## 2019-01-10 DIAGNOSIS — J3489 Other specified disorders of nose and nasal sinuses: Secondary | ICD-10-CM | POA: Diagnosis not present

## 2019-01-10 DIAGNOSIS — Z20828 Contact with and (suspected) exposure to other viral communicable diseases: Secondary | ICD-10-CM | POA: Diagnosis not present

## 2019-01-16 DIAGNOSIS — I10 Essential (primary) hypertension: Secondary | ICD-10-CM | POA: Diagnosis not present

## 2019-01-16 DIAGNOSIS — M5416 Radiculopathy, lumbar region: Secondary | ICD-10-CM | POA: Diagnosis not present

## 2019-01-16 DIAGNOSIS — Z6832 Body mass index (BMI) 32.0-32.9, adult: Secondary | ICD-10-CM | POA: Diagnosis not present

## 2019-01-16 DIAGNOSIS — M48061 Spinal stenosis, lumbar region without neurogenic claudication: Secondary | ICD-10-CM | POA: Diagnosis not present

## 2019-02-15 DIAGNOSIS — M48061 Spinal stenosis, lumbar region without neurogenic claudication: Secondary | ICD-10-CM | POA: Diagnosis not present

## 2019-02-20 DIAGNOSIS — Z0000 Encounter for general adult medical examination without abnormal findings: Secondary | ICD-10-CM | POA: Diagnosis not present

## 2019-02-20 DIAGNOSIS — F320 Major depressive disorder, single episode, mild: Secondary | ICD-10-CM | POA: Diagnosis not present

## 2019-02-20 DIAGNOSIS — E782 Mixed hyperlipidemia: Secondary | ICD-10-CM | POA: Diagnosis not present

## 2019-02-20 DIAGNOSIS — Z6833 Body mass index (BMI) 33.0-33.9, adult: Secondary | ICD-10-CM | POA: Diagnosis not present

## 2019-02-20 DIAGNOSIS — Z1331 Encounter for screening for depression: Secondary | ICD-10-CM | POA: Diagnosis not present

## 2019-02-20 DIAGNOSIS — Z9181 History of falling: Secondary | ICD-10-CM | POA: Diagnosis not present

## 2019-02-20 DIAGNOSIS — I201 Angina pectoris with documented spasm: Secondary | ICD-10-CM | POA: Diagnosis not present

## 2019-02-20 DIAGNOSIS — E039 Hypothyroidism, unspecified: Secondary | ICD-10-CM | POA: Diagnosis not present

## 2019-02-20 DIAGNOSIS — D510 Vitamin B12 deficiency anemia due to intrinsic factor deficiency: Secondary | ICD-10-CM | POA: Diagnosis not present

## 2019-02-20 DIAGNOSIS — I10 Essential (primary) hypertension: Secondary | ICD-10-CM | POA: Diagnosis not present

## 2019-03-08 DIAGNOSIS — N200 Calculus of kidney: Secondary | ICD-10-CM | POA: Diagnosis not present

## 2019-03-08 DIAGNOSIS — R3121 Asymptomatic microscopic hematuria: Secondary | ICD-10-CM | POA: Diagnosis not present

## 2019-03-08 DIAGNOSIS — N401 Enlarged prostate with lower urinary tract symptoms: Secondary | ICD-10-CM | POA: Diagnosis not present

## 2019-03-08 DIAGNOSIS — R3915 Urgency of urination: Secondary | ICD-10-CM | POA: Diagnosis not present

## 2019-04-07 DIAGNOSIS — N3000 Acute cystitis without hematuria: Secondary | ICD-10-CM | POA: Diagnosis not present

## 2019-04-10 DIAGNOSIS — R3121 Asymptomatic microscopic hematuria: Secondary | ICD-10-CM | POA: Diagnosis not present

## 2019-04-10 DIAGNOSIS — N3000 Acute cystitis without hematuria: Secondary | ICD-10-CM | POA: Diagnosis not present

## 2019-04-16 DIAGNOSIS — Z8669 Personal history of other diseases of the nervous system and sense organs: Secondary | ICD-10-CM | POA: Diagnosis not present

## 2019-04-16 DIAGNOSIS — H903 Sensorineural hearing loss, bilateral: Secondary | ICD-10-CM | POA: Diagnosis not present

## 2019-04-16 DIAGNOSIS — H938X1 Other specified disorders of right ear: Secondary | ICD-10-CM | POA: Diagnosis not present

## 2019-04-17 DIAGNOSIS — M48061 Spinal stenosis, lumbar region without neurogenic claudication: Secondary | ICD-10-CM | POA: Diagnosis not present

## 2019-04-25 DIAGNOSIS — Z6832 Body mass index (BMI) 32.0-32.9, adult: Secondary | ICD-10-CM | POA: Diagnosis not present

## 2019-04-25 DIAGNOSIS — M5416 Radiculopathy, lumbar region: Secondary | ICD-10-CM | POA: Diagnosis not present

## 2019-04-25 DIAGNOSIS — M48061 Spinal stenosis, lumbar region without neurogenic claudication: Secondary | ICD-10-CM | POA: Diagnosis not present

## 2019-04-25 DIAGNOSIS — I10 Essential (primary) hypertension: Secondary | ICD-10-CM | POA: Diagnosis not present

## 2019-06-04 DIAGNOSIS — I10 Essential (primary) hypertension: Secondary | ICD-10-CM | POA: Diagnosis not present

## 2019-06-04 DIAGNOSIS — E039 Hypothyroidism, unspecified: Secondary | ICD-10-CM | POA: Diagnosis not present

## 2019-06-04 DIAGNOSIS — D519 Vitamin B12 deficiency anemia, unspecified: Secondary | ICD-10-CM | POA: Diagnosis not present

## 2019-06-04 DIAGNOSIS — E785 Hyperlipidemia, unspecified: Secondary | ICD-10-CM | POA: Diagnosis not present

## 2019-07-10 DIAGNOSIS — S93491A Sprain of other ligament of right ankle, initial encounter: Secondary | ICD-10-CM | POA: Insufficient documentation

## 2019-07-10 HISTORY — DX: Sprain of other ligament of right ankle, initial encounter: S93.491A

## 2019-07-19 DIAGNOSIS — M48061 Spinal stenosis, lumbar region without neurogenic claudication: Secondary | ICD-10-CM | POA: Diagnosis not present

## 2019-07-20 DIAGNOSIS — M6281 Muscle weakness (generalized): Secondary | ICD-10-CM | POA: Diagnosis not present

## 2019-07-20 DIAGNOSIS — M25571 Pain in right ankle and joints of right foot: Secondary | ICD-10-CM | POA: Diagnosis not present

## 2019-07-20 DIAGNOSIS — M25671 Stiffness of right ankle, not elsewhere classified: Secondary | ICD-10-CM | POA: Diagnosis not present

## 2019-07-23 DIAGNOSIS — M25671 Stiffness of right ankle, not elsewhere classified: Secondary | ICD-10-CM | POA: Diagnosis not present

## 2019-07-23 DIAGNOSIS — M25571 Pain in right ankle and joints of right foot: Secondary | ICD-10-CM | POA: Diagnosis not present

## 2019-07-23 DIAGNOSIS — M6281 Muscle weakness (generalized): Secondary | ICD-10-CM | POA: Diagnosis not present

## 2019-07-25 DIAGNOSIS — M25571 Pain in right ankle and joints of right foot: Secondary | ICD-10-CM | POA: Diagnosis not present

## 2019-07-25 DIAGNOSIS — M25671 Stiffness of right ankle, not elsewhere classified: Secondary | ICD-10-CM | POA: Diagnosis not present

## 2019-07-25 DIAGNOSIS — M6281 Muscle weakness (generalized): Secondary | ICD-10-CM | POA: Diagnosis not present

## 2019-08-01 DIAGNOSIS — M6281 Muscle weakness (generalized): Secondary | ICD-10-CM | POA: Diagnosis not present

## 2019-08-01 DIAGNOSIS — M25571 Pain in right ankle and joints of right foot: Secondary | ICD-10-CM | POA: Diagnosis not present

## 2019-08-01 DIAGNOSIS — M25671 Stiffness of right ankle, not elsewhere classified: Secondary | ICD-10-CM | POA: Diagnosis not present

## 2019-08-04 DIAGNOSIS — I10 Essential (primary) hypertension: Secondary | ICD-10-CM | POA: Diagnosis not present

## 2019-08-04 DIAGNOSIS — D519 Vitamin B12 deficiency anemia, unspecified: Secondary | ICD-10-CM | POA: Diagnosis not present

## 2019-08-04 DIAGNOSIS — E785 Hyperlipidemia, unspecified: Secondary | ICD-10-CM | POA: Diagnosis not present

## 2019-08-04 DIAGNOSIS — E039 Hypothyroidism, unspecified: Secondary | ICD-10-CM | POA: Diagnosis not present

## 2019-08-10 DIAGNOSIS — M25571 Pain in right ankle and joints of right foot: Secondary | ICD-10-CM | POA: Diagnosis not present

## 2019-08-10 DIAGNOSIS — M6281 Muscle weakness (generalized): Secondary | ICD-10-CM | POA: Diagnosis not present

## 2019-08-10 DIAGNOSIS — M25671 Stiffness of right ankle, not elsewhere classified: Secondary | ICD-10-CM | POA: Diagnosis not present

## 2019-08-15 DIAGNOSIS — M25671 Stiffness of right ankle, not elsewhere classified: Secondary | ICD-10-CM | POA: Diagnosis not present

## 2019-08-15 DIAGNOSIS — M6281 Muscle weakness (generalized): Secondary | ICD-10-CM | POA: Diagnosis not present

## 2019-08-15 DIAGNOSIS — M25571 Pain in right ankle and joints of right foot: Secondary | ICD-10-CM | POA: Diagnosis not present

## 2019-08-16 DIAGNOSIS — M48061 Spinal stenosis, lumbar region without neurogenic claudication: Secondary | ICD-10-CM | POA: Diagnosis not present

## 2019-08-16 DIAGNOSIS — M5416 Radiculopathy, lumbar region: Secondary | ICD-10-CM | POA: Diagnosis not present

## 2019-09-04 DIAGNOSIS — I10 Essential (primary) hypertension: Secondary | ICD-10-CM | POA: Diagnosis not present

## 2019-09-04 DIAGNOSIS — E039 Hypothyroidism, unspecified: Secondary | ICD-10-CM | POA: Diagnosis not present

## 2019-09-04 DIAGNOSIS — D519 Vitamin B12 deficiency anemia, unspecified: Secondary | ICD-10-CM | POA: Diagnosis not present

## 2019-09-04 DIAGNOSIS — E785 Hyperlipidemia, unspecified: Secondary | ICD-10-CM | POA: Diagnosis not present

## 2019-10-16 DIAGNOSIS — G5602 Carpal tunnel syndrome, left upper limb: Secondary | ICD-10-CM | POA: Diagnosis not present

## 2019-10-16 DIAGNOSIS — M5416 Radiculopathy, lumbar region: Secondary | ICD-10-CM | POA: Diagnosis not present

## 2019-10-16 DIAGNOSIS — M48061 Spinal stenosis, lumbar region without neurogenic claudication: Secondary | ICD-10-CM | POA: Diagnosis not present

## 2019-10-22 DIAGNOSIS — M48062 Spinal stenosis, lumbar region with neurogenic claudication: Secondary | ICD-10-CM | POA: Diagnosis not present

## 2019-10-22 DIAGNOSIS — M5416 Radiculopathy, lumbar region: Secondary | ICD-10-CM | POA: Diagnosis not present

## 2019-10-26 DIAGNOSIS — Z23 Encounter for immunization: Secondary | ICD-10-CM | POA: Diagnosis not present

## 2019-11-08 DIAGNOSIS — R8271 Bacteriuria: Secondary | ICD-10-CM | POA: Diagnosis not present

## 2019-11-08 DIAGNOSIS — N200 Calculus of kidney: Secondary | ICD-10-CM | POA: Diagnosis not present

## 2019-11-08 DIAGNOSIS — R1032 Left lower quadrant pain: Secondary | ICD-10-CM | POA: Diagnosis not present

## 2019-11-08 DIAGNOSIS — R3121 Asymptomatic microscopic hematuria: Secondary | ICD-10-CM | POA: Diagnosis not present

## 2019-11-19 DIAGNOSIS — M48061 Spinal stenosis, lumbar region without neurogenic claudication: Secondary | ICD-10-CM | POA: Diagnosis not present

## 2019-12-18 DIAGNOSIS — R3121 Asymptomatic microscopic hematuria: Secondary | ICD-10-CM | POA: Diagnosis not present

## 2019-12-18 DIAGNOSIS — N3000 Acute cystitis without hematuria: Secondary | ICD-10-CM | POA: Diagnosis not present

## 2019-12-31 DIAGNOSIS — K5730 Diverticulosis of large intestine without perforation or abscess without bleeding: Secondary | ICD-10-CM | POA: Diagnosis not present

## 2019-12-31 DIAGNOSIS — K219 Gastro-esophageal reflux disease without esophagitis: Secondary | ICD-10-CM | POA: Diagnosis not present

## 2019-12-31 DIAGNOSIS — K648 Other hemorrhoids: Secondary | ICD-10-CM | POA: Diagnosis not present

## 2020-01-24 DIAGNOSIS — M48062 Spinal stenosis, lumbar region with neurogenic claudication: Secondary | ICD-10-CM | POA: Diagnosis not present

## 2020-02-08 DIAGNOSIS — Z6834 Body mass index (BMI) 34.0-34.9, adult: Secondary | ICD-10-CM | POA: Diagnosis not present

## 2020-02-08 DIAGNOSIS — G5602 Carpal tunnel syndrome, left upper limb: Secondary | ICD-10-CM | POA: Diagnosis not present

## 2020-02-08 DIAGNOSIS — I10 Essential (primary) hypertension: Secondary | ICD-10-CM | POA: Diagnosis not present

## 2020-02-25 DIAGNOSIS — F330 Major depressive disorder, recurrent, mild: Secondary | ICD-10-CM | POA: Diagnosis not present

## 2020-02-25 DIAGNOSIS — E782 Mixed hyperlipidemia: Secondary | ICD-10-CM | POA: Diagnosis not present

## 2020-02-25 DIAGNOSIS — D510 Vitamin B12 deficiency anemia due to intrinsic factor deficiency: Secondary | ICD-10-CM | POA: Diagnosis not present

## 2020-02-25 DIAGNOSIS — Z79899 Other long term (current) drug therapy: Secondary | ICD-10-CM | POA: Diagnosis not present

## 2020-02-25 DIAGNOSIS — I10 Essential (primary) hypertension: Secondary | ICD-10-CM | POA: Diagnosis not present

## 2020-02-25 DIAGNOSIS — Z0000 Encounter for general adult medical examination without abnormal findings: Secondary | ICD-10-CM | POA: Diagnosis not present

## 2020-02-25 DIAGNOSIS — Z6832 Body mass index (BMI) 32.0-32.9, adult: Secondary | ICD-10-CM | POA: Diagnosis not present

## 2020-02-25 DIAGNOSIS — E039 Hypothyroidism, unspecified: Secondary | ICD-10-CM | POA: Diagnosis not present

## 2020-02-25 DIAGNOSIS — K219 Gastro-esophageal reflux disease without esophagitis: Secondary | ICD-10-CM | POA: Diagnosis not present

## 2020-02-25 DIAGNOSIS — J309 Allergic rhinitis, unspecified: Secondary | ICD-10-CM | POA: Diagnosis not present

## 2020-02-25 DIAGNOSIS — I201 Angina pectoris with documented spasm: Secondary | ICD-10-CM | POA: Diagnosis not present

## 2020-02-25 DIAGNOSIS — Z9181 History of falling: Secondary | ICD-10-CM | POA: Diagnosis not present

## 2020-02-28 ENCOUNTER — Encounter: Payer: Self-pay | Admitting: *Deleted

## 2020-03-03 ENCOUNTER — Encounter: Payer: Self-pay | Admitting: Cardiology

## 2020-03-03 DIAGNOSIS — M48061 Spinal stenosis, lumbar region without neurogenic claudication: Secondary | ICD-10-CM | POA: Insufficient documentation

## 2020-03-13 DIAGNOSIS — K219 Gastro-esophageal reflux disease without esophagitis: Secondary | ICD-10-CM | POA: Insufficient documentation

## 2020-03-13 DIAGNOSIS — E079 Disorder of thyroid, unspecified: Secondary | ICD-10-CM | POA: Insufficient documentation

## 2020-03-13 DIAGNOSIS — K635 Polyp of colon: Secondary | ICD-10-CM | POA: Insufficient documentation

## 2020-03-13 DIAGNOSIS — F32A Depression, unspecified: Secondary | ICD-10-CM | POA: Insufficient documentation

## 2020-03-13 DIAGNOSIS — F320 Major depressive disorder, single episode, mild: Secondary | ICD-10-CM | POA: Insufficient documentation

## 2020-03-13 DIAGNOSIS — N200 Calculus of kidney: Secondary | ICD-10-CM | POA: Insufficient documentation

## 2020-03-13 DIAGNOSIS — J189 Pneumonia, unspecified organism: Secondary | ICD-10-CM | POA: Insufficient documentation

## 2020-03-13 DIAGNOSIS — M1990 Unspecified osteoarthritis, unspecified site: Secondary | ICD-10-CM | POA: Insufficient documentation

## 2020-03-13 DIAGNOSIS — K589 Irritable bowel syndrome without diarrhea: Secondary | ICD-10-CM | POA: Insufficient documentation

## 2020-03-13 DIAGNOSIS — D510 Vitamin B12 deficiency anemia due to intrinsic factor deficiency: Secondary | ICD-10-CM | POA: Insufficient documentation

## 2020-03-13 DIAGNOSIS — G4730 Sleep apnea, unspecified: Secondary | ICD-10-CM | POA: Insufficient documentation

## 2020-03-13 DIAGNOSIS — K5792 Diverticulitis of intestine, part unspecified, without perforation or abscess without bleeding: Secondary | ICD-10-CM | POA: Insufficient documentation

## 2020-03-19 NOTE — Progress Notes (Unsigned)
Cardiology Office Note:    Date:  03/20/2020   ID:  Miguel Morgan, DOB 01/23/1940, MRN [id number]  PCP:  [name], MD  Cardiologist:  [name], MD   Referring MD: [name], MD  ASSESSMENT:    1. Mild CAD   2. Coronary artery anomaly, anomalous aortic origin   3. Mixed hyperlipidemia   4. Primary hypertension    PLAN:    In order of problems listed above:  1. He has a history of both mild nonobstructive CAD and anomalous coronary artery has done well on medical therapy has had no angina for many years and will continue his current regimen including aspirin beta-blocker calcium channel blocker and lipid-lowering with a high intensity statin.  We discussed doing an ischemia evaluation like a cardiac CTA however he is asymptomatic and at this time I would not advise him and he does not want to have it done.  If having angina we will set him up for an outpatient cardiac CTA. 2. He is anomaly was a benign variant and does not require cardiothoracic surgery 3. Lipids are ideal continue with statin 4. BP at target on current regimen ARB beta-blocker calcium channel blocker  Next appointment 1 year   Medication Adjustments/Labs and Tests Ordered: Current medicines are reviewed at length with the patient today.  Concerns regarding medicines are outlined above.  Orders Placed This Encounter  Procedures  . EKG 12-Lead   No orders of the defined types were placed in this encounter.   Chief complaint: He is here today to reestablish cardiology care with a history of CAD  History of Present Illness:    Miguel Morgan is a 80 y.o. male who is being seen today for the evaluation of angina at the request of [name], MD.  He has a history of hypertension hyperlipidemia and CAD.  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 02/27/2015 showed sinus bradycardia 56 bpm and was normal Echocardiogram in 2011 [facility] health was normal with no valvular abnormality.  I reviewed his history he was having anginal discomfort prior to coronary angiography and with the institution of medical therapy he has never had angina again.  He is a vigorous active man no exercise intolerance shortness of breath palpitation or syncope he is on lipid-lowering therapy with a high intensity statin and his lipids are at target with a cholesterol 151 triglycerides 95 HDL 49. Past Medical History:  Diagnosis Date  . Arthritis   . Colon polyps   . Coronary artery disease    nonobstructive CAD  . Diverticulitis   . GERD (gastroesophageal reflux disease)   . Hyperlipidemia   . Hypertension   . Hypothyroid 06/29/2011  . IBS (irritable bowel syndrome)   . Kidney stones   . Lumbar spinal stenosis   . Mild depression ([place])   . Pernicious anemia   . Pneumonia   . Primary osteoarthritis of left knee 06/04/2015  . 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  . Sleep apnea    uses cpap  . Sprain of anterior talofibular ligament of right ankle 07/10/2019  . Thyroid disease    hypothyroidism    Past Surgical History:  Procedure Laterality Date  . CARDIAC CATHETERIZATION  10/15/2009   only mild CAD. 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 Normal EF.   Marland Kitchen  CARPAL TUNNEL RELEASE Right   . cataract surgery Bilateral    with lens implant  . COLONOSCOPY    . HEMORRHOID SURGERY    . KNEE SURGERY     right - partial knee replacement  . LITHOTRIPSY    . LUMBAR DISC SURGERY  04/15/2010   L2-3 central decompression, L4-5 left decompression. Dr. Lang Snow  . TOTAL KNEE ARTHROPLASTY Left 06/09/2015   Procedure: TOTAL KNEE ARTHROPLASTY;  Surgeon: Vickey Huger, MD;  Location: [facility];  Service: Orthopedics;  Laterality: Left;  . UMBILICAL HERNIA REPAIR    . VASECTOMY      Current  Medications: Current Meds  Medication Sig  . Ascorbic Acid (VITAMIN C) 1000 MG tablet Take 1,000 mg by mouth daily.  Marland Kitchen aspirin EC 81 MG tablet Take 81 mg by mouth daily. Swallow whole.  Marland Kitchen atenolol (TENORMIN) 50 MG tablet Take 50 mg by mouth daily.  . cholecalciferol (VITAMIN D3) 25 MCG (1000 UNIT) tablet Take 1,000 Units by mouth daily.  . Cyanocobalamin (VITAMIN B-12 PO) Take 5,000 mcg by mouth daily.  Marland Kitchen diltiazem (TIAZAC) 360 MG 24 hr capsule Take 360 mg by mouth daily.  Marland Kitchen escitalopram (LEXAPRO) 10 MG tablet Take 10 mg by mouth daily.  Marland Kitchen levothyroxine (SYNTHROID) 150 MCG tablet Take 1 tablet (150 mcg total) by mouth daily.  Marland Kitchen losartan (COZAAR) 100 MG tablet Take 1 tablet (100 mg total) by mouth daily.  . montelukast (SINGULAIR) 10 MG tablet Take 10 mg by mouth at bedtime.  . nitroGLYCERIN (NITROSTAT) 0.4 MG SL tablet Place 0.4 mg under the tongue as directed.  Marland Kitchen omeprazole (PRILOSEC) 40 MG capsule Take 40 mg by mouth daily.  . rosuvastatin (CRESTOR) 5 MG tablet Take 1 tablet (5 mg total) by mouth daily.     Allergies:   Hydrocodone and Hydrocodone-acetaminophen   Social History   Socioeconomic History  . Marital status: Married    Spouse name: Not on file  . Number of children: Not on file  . Years of education: Not on file  . Highest education level: Not on file  Occupational History  . Not on file  Tobacco Use  . Smoking status: Never Smoker  . Smokeless tobacco: Never Used  Substance and Sexual Activity  . Alcohol use: No  . Drug use: No  . Sexual activity: Not on file  Other Topics Concern  . Not on file  Social History Narrative  . Not on file   Social Determinants of Health   Financial Resource Strain: Not on file  Food Insecurity: Not on file  Transportation Needs: Not on file  Physical Activity: Not on file  Stress: Not on file  Social Connections: Not on file     Family History: The patient's family history includes Diabetes in his sister; Heart attack  in his mother; Hypertension in his mother and sister; Stomach cancer in his father.  ROS:   ROS Please see the history of present illness.     All other systems reviewed and are negative.  EKGs/Labs/Other Studies Reviewed:    The following studies were reviewed today:   EKG:  EKG is  ordered today.  The ekg ordered today is personally reviewed and demonstrates sinus bradycardia 56 bpm normal EKG    Physical Exam:    VS:  BP 140/62   Pulse (!) 56   Ht 5\' 11"  (1.803 m)   Wt 231 lb 6.4 oz (105 kg)   SpO2 96%   BMI 32.27 kg/m  Wt Readings from Last 3 Encounters:  03/20/20 231 lb 6.4 oz (105 kg)  02/25/20 235 lb (106.6 kg)  06/09/15 233 lb (105.7 kg)     GEN:  Well nourished, well developed in no acute distress HEENT: Normal NECK: No JVD; No carotid bruits LYMPHATICS: No lymphadenopathy CARDIAC: RRR, no murmurs, rubs, gallops RESPIRATORY:  Clear to auscultation without rales, wheezing or rhonchi  ABDOMEN: Soft, non-tender, non-distended MUSCULOSKELETAL:  No edema; No deformity  SKIN: Warm and dry NEUROLOGIC:  Alert and oriented x 3 PSYCHIATRIC:  Normal affect     Signed, [name], MD  03/20/2020 4:11 PM    [facility]

## 2020-03-20 ENCOUNTER — Encounter: Payer: Self-pay | Admitting: Cardiology

## 2020-03-20 ENCOUNTER — Ambulatory Visit: Payer: PPO | Admitting: Cardiology

## 2020-03-20 ENCOUNTER — Other Ambulatory Visit: Payer: Self-pay

## 2020-03-20 VITALS — BP 140/62 | HR 56 | Ht 71.0 in | Wt 231.4 lb

## 2020-03-20 DIAGNOSIS — I10 Essential (primary) hypertension: Secondary | ICD-10-CM | POA: Diagnosis not present

## 2020-03-20 DIAGNOSIS — Q245 Malformation of coronary vessels: Secondary | ICD-10-CM

## 2020-03-20 DIAGNOSIS — I2510 Atherosclerotic heart disease of native coronary artery without angina pectoris: Secondary | ICD-10-CM

## 2020-03-20 DIAGNOSIS — E782 Mixed hyperlipidemia: Secondary | ICD-10-CM

## 2020-03-20 NOTE — Patient Instructions (Signed)

## 2020-03-25 DIAGNOSIS — Z8052 Family history of malignant neoplasm of bladder: Secondary | ICD-10-CM | POA: Diagnosis not present

## 2020-03-25 DIAGNOSIS — G4733 Obstructive sleep apnea (adult) (pediatric): Secondary | ICD-10-CM | POA: Diagnosis not present

## 2020-04-01 DIAGNOSIS — Z8052 Family history of malignant neoplasm of bladder: Secondary | ICD-10-CM | POA: Diagnosis not present

## 2020-04-01 DIAGNOSIS — G4733 Obstructive sleep apnea (adult) (pediatric): Secondary | ICD-10-CM | POA: Diagnosis not present

## 2020-04-17 DIAGNOSIS — M5416 Radiculopathy, lumbar region: Secondary | ICD-10-CM | POA: Diagnosis not present

## 2020-05-22 DIAGNOSIS — M5416 Radiculopathy, lumbar region: Secondary | ICD-10-CM | POA: Diagnosis not present

## 2020-05-22 DIAGNOSIS — G5602 Carpal tunnel syndrome, left upper limb: Secondary | ICD-10-CM | POA: Diagnosis not present

## 2020-05-22 DIAGNOSIS — M48062 Spinal stenosis, lumbar region with neurogenic claudication: Secondary | ICD-10-CM | POA: Diagnosis not present

## 2020-06-16 DIAGNOSIS — N3020 Other chronic cystitis without hematuria: Secondary | ICD-10-CM | POA: Diagnosis not present

## 2020-06-16 DIAGNOSIS — N281 Cyst of kidney, acquired: Secondary | ICD-10-CM | POA: Diagnosis not present

## 2020-06-16 DIAGNOSIS — N200 Calculus of kidney: Secondary | ICD-10-CM | POA: Diagnosis not present

## 2020-07-21 DIAGNOSIS — M5416 Radiculopathy, lumbar region: Secondary | ICD-10-CM | POA: Diagnosis not present

## 2020-08-03 DIAGNOSIS — D510 Vitamin B12 deficiency anemia due to intrinsic factor deficiency: Secondary | ICD-10-CM | POA: Diagnosis not present

## 2020-08-03 DIAGNOSIS — E785 Hyperlipidemia, unspecified: Secondary | ICD-10-CM | POA: Diagnosis not present

## 2020-08-03 DIAGNOSIS — I10 Essential (primary) hypertension: Secondary | ICD-10-CM | POA: Diagnosis not present

## 2020-08-03 DIAGNOSIS — E039 Hypothyroidism, unspecified: Secondary | ICD-10-CM | POA: Diagnosis not present

## 2020-09-10 DIAGNOSIS — M48062 Spinal stenosis, lumbar region with neurogenic claudication: Secondary | ICD-10-CM | POA: Diagnosis not present

## 2020-09-10 DIAGNOSIS — M5416 Radiculopathy, lumbar region: Secondary | ICD-10-CM | POA: Diagnosis not present

## 2020-09-10 DIAGNOSIS — Z6830 Body mass index (BMI) 30.0-30.9, adult: Secondary | ICD-10-CM | POA: Diagnosis not present

## 2020-09-10 DIAGNOSIS — I10 Essential (primary) hypertension: Secondary | ICD-10-CM | POA: Diagnosis not present

## 2020-10-21 DIAGNOSIS — M5416 Radiculopathy, lumbar region: Secondary | ICD-10-CM | POA: Diagnosis not present

## 2020-11-04 DIAGNOSIS — Z23 Encounter for immunization: Secondary | ICD-10-CM | POA: Diagnosis not present

## 2020-11-10 DIAGNOSIS — J209 Acute bronchitis, unspecified: Secondary | ICD-10-CM | POA: Diagnosis not present

## 2020-11-10 DIAGNOSIS — H109 Unspecified conjunctivitis: Secondary | ICD-10-CM | POA: Diagnosis not present

## 2020-11-10 DIAGNOSIS — Z6830 Body mass index (BMI) 30.0-30.9, adult: Secondary | ICD-10-CM | POA: Diagnosis not present

## 2020-12-03 DIAGNOSIS — M48062 Spinal stenosis, lumbar region with neurogenic claudication: Secondary | ICD-10-CM | POA: Diagnosis not present

## 2020-12-03 DIAGNOSIS — M5416 Radiculopathy, lumbar region: Secondary | ICD-10-CM | POA: Diagnosis not present

## 2020-12-03 DIAGNOSIS — G5602 Carpal tunnel syndrome, left upper limb: Secondary | ICD-10-CM | POA: Diagnosis not present

## 2020-12-11 DIAGNOSIS — S61210A Laceration without foreign body of right index finger without damage to nail, initial encounter: Secondary | ICD-10-CM | POA: Diagnosis not present

## 2021-01-27 ENCOUNTER — Telehealth: Payer: Self-pay | Admitting: Cardiology

## 2021-01-27 NOTE — Telephone Encounter (Signed)
Patient wife called stating that he has cardia mobile, and it's showing that he is having skipped beats.  She is wondering if he should come in sooner than his April appt to be seen.

## 2021-01-27 NOTE — Telephone Encounter (Signed)
Spoke to the patients wife just now and she let me know that the patients [facility] device is stating that he is in "possible atrial fibrillation". I asked her if she could send the reading over to [place] via Mychart and she states that she will. She will call back if she has any trouble with this but we discussed how to send it over and she feels confident that she can do it.    Encouraged patient to call back with any questions or concerns.

## 2021-04-02 ENCOUNTER — Telehealth: Payer: Self-pay | Admitting: Cardiology

## 2021-04-02 NOTE — Telephone Encounter (Signed)
Called patients wife and informed her that as long as the patient is not symptomatic that they should keep the 04/09/21 appointment. I also instructed her that if the patient becomes short of breath or his heart rate starts racing to give [place] a call.Patient's wife appreciated the call and had no further questions at this time. ?

## 2021-04-02 NOTE — Telephone Encounter (Signed)
New Message: ? ? ?Patient's wife called. She said patient's primary doctor(Dr Humphrey Rolls) had order a Monitor for patient to wear. Patient received a call from Dr Chancy Milroy yesterday, saying that the Monitor results showed Afib and skipped beats. He told the patient that he need to see his Cardiologist. The patient have an appointment scheduled on next Thursday(04-09-21). The wife question is should he just keep that appointment or does he need to be seen this week? Dr Chancy Milroy said he was going to send the results to Dr Bettina Gavia.  ?

## 2021-04-09 ENCOUNTER — Ambulatory Visit: Payer: PPO | Admitting: Cardiology

## 2021-04-09 ENCOUNTER — Encounter: Payer: Self-pay | Admitting: Cardiology

## 2021-04-09 ENCOUNTER — Telehealth: Payer: Self-pay | Admitting: *Deleted

## 2021-04-09 VITALS — BP 158/80 | HR 54 | Ht 71.0 in | Wt 219.0 lb

## 2021-04-09 DIAGNOSIS — Q245 Malformation of coronary vessels: Secondary | ICD-10-CM

## 2021-04-09 DIAGNOSIS — I2510 Atherosclerotic heart disease of native coronary artery without angina pectoris: Secondary | ICD-10-CM

## 2021-04-09 DIAGNOSIS — I480 Paroxysmal atrial fibrillation: Secondary | ICD-10-CM

## 2021-04-09 DIAGNOSIS — I10 Essential (primary) hypertension: Secondary | ICD-10-CM | POA: Diagnosis not present

## 2021-04-09 MED ORDER — APIXABAN 5 MG PO TABS: 5.0000 mg | ORAL_TABLET | Freq: Two times a day (BID) | ORAL | 11 refills | Status: DC

## 2021-04-09 MED ORDER — METOPROLOL TARTRATE 25 MG PO TABS: 25.0000 mg | ORAL_TABLET | Freq: Every day | ORAL | 3 refills | Status: DC

## 2021-04-09 NOTE — Progress Notes (Signed)
?Cardiology Office Note:   ? ?Date:  04/09/2021  ? ?ID:  Miguel Morgan, DOB 04-25-1940, MRN [id number] ? ?PCP:  [name], MD  ?Cardiologist:  [name], MD   ? ?Referring MD: [name], MD  ? ? ?ASSESSMENT:   ? ?1. Paroxysmal atrial fibrillation (HCC)   ?2. Mild CAD   ?3. Coronary artery anomaly, anomalous aortic origin   ?4. Primary hypertension   ? ?PLAN:   ? ?In order of problems listed above: ? ?Present we are able to access his event monitor he has documented atrial fibrillation flutter moderate stroke risk he will transition to Eliquis stop aspirin I will place him on a more effective beta-blocker with metoprolol.  Check echocardiogram structurally normal heart start antiarrhythmic drug therapy flecainide. ?Stable CAD ?Trend his blood pressures, hold off on additional antihypertensive agent for now ? ? ?Next appointment: 6 weeks ? ? ?Medication Adjustments/Labs and Tests Ordered: ?Current medicines are reviewed at length with the patient today.  Concerns regarding medicines are outlined above.  ?Orders Placed This Encounter  ?Procedures  ? EKG 12-Lead  ? ECHOCARDIOGRAM COMPLETE  ? ?Meds ordered this encounter  ?Medications  ? metoprolol tartrate (LOPRESSOR) 25 MG tablet  ?  Sig: Take 1 tablet (25 mg total) by mouth daily.  ?  Dispense:  90 tablet  ?  Refill:  3  ? apixaban (ELIQUIS) 5 MG TABS tablet  ?  Sig: Take 1 tablet (5 mg total) by mouth 2 (two) times daily.  ?  Dispense:  60 tablet  ?  Refill:  11  ? ? ?Chief Complaint  ?Patient presents with  ? Follow-up  ? Coronary Artery Disease  ? ? ?History of Present Illness:   ? ?Miguel Morgan is a 81 y.o. male with a hx of CAD with mild nonobstructive CAD anomalous origin of the left circumflex coronary artery from the right coronary cusp felt to be benign variant hypertension and hyper lipidemia last seen 03/20/2020. ? ?I was unaware that he had worn a event monitor for 14 days ?My office access to the report he had a 2% incidence of  atrial fibrillation flutter with rates often up to 150 bpm. ?Tends to occur in the early morning hours. ?He is at moderate stroke risk with age and hypertension. ?We will transition from aspirin to Eliquis ?He is aware of the rapid heart rate that is not severely symptom attic no chest pain edema shortness of breath or syncope ?No bleeding previously to put him at risk with Eliquis and has not had TIA or stroke ?Compliance with diet, lifestyle and medications: Yes ?Past Medical History:  ?Diagnosis Date  ? Arthritis   ? Colon polyps   ? Coronary artery disease   ? nonobstructive CAD  ? Diverticulitis   ? GERD (gastroesophageal reflux disease)   ? Hyperlipidemia   ? Hypertension   ? Hypothyroid 06/29/2011  ? IBS (irritable bowel syndrome)   ? Kidney stones   ? Lumbar spinal stenosis   ? Mild depression   ? Pernicious anemia   ? Pneumonia   ? Primary osteoarthritis of left knee 06/04/2015  ? 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  ? Sleep apnea   ? uses cpap  ? Sprain of anterior talofibular ligament of right ankle 07/10/2019  ? Thyroid disease   ? hypothyroidism  ? ? ?Past Surgical History:  ?Procedure Laterality Date  ? CARDIAC CATHETERIZATION  10/15/2009  ? only mild CAD. Anomalous LCX from right coronary cusp,  30% proximal LAD stenosis. Normal EF.   ? CARPAL TUNNEL RELEASE Right   ? cataract surgery Bilateral   ? with lens implant  ? COLONOSCOPY    ? HEMORRHOID SURGERY    ? KNEE SURGERY    ? right - partial knee replacement  ? LITHOTRIPSY    ? [place] SURGERY  04/15/2010  ? L2-3 central decompression, L4-5 left decompression. Dr. Lang Snow  ? TOTAL KNEE ARTHROPLASTY Left 06/09/2015  ? Procedure: TOTAL KNEE ARTHROPLASTY;  Surgeon: Vickey Huger, MD;  Location: [facility];  Service: Orthopedics;  Laterality: Left;  ? UMBILICAL HERNIA REPAIR    ? VASECTOMY    ? ? ?Current Medications: ?Current Meds  ?Medication Sig  ? apixaban (ELIQUIS) 5 MG TABS tablet Take 1 tablet (5 mg total) by mouth 2 (two) times daily.  ? Ascorbic Acid  (VITAMIN C) 1000 MG tablet Take 1,000 mg by mouth daily.  ? cholecalciferol (VITAMIN D3) 25 MCG (1000 UNIT) tablet Take 1,000 Units by mouth daily.  ? Cyanocobalamin (VITAMIN B-12 PO) Take 5,000 mcg by mouth daily.  ? diltiazem (TIAZAC) 360 MG 24 hr capsule Take 360 mg by mouth daily.  ? escitalopram (LEXAPRO) 10 MG tablet Take 10 mg by mouth daily.  ? levothyroxine (SYNTHROID) 150 MCG tablet Take 1 tablet (150 mcg total) by mouth daily.  ? losartan (COZAAR) 100 MG tablet Take 1 tablet (100 mg total) by mouth daily.  ? metoprolol tartrate (LOPRESSOR) 25 MG tablet Take 1 tablet (25 mg total) by mouth daily.  ? nitroGLYCERIN (NITROSTAT) 0.4 MG SL tablet Place 0.4 mg under the tongue every 5 (five) minutes as needed for chest pain.  ? omeprazole (PRILOSEC) 40 MG capsule Take 40 mg by mouth daily.  ? rosuvastatin (CRESTOR) 5 MG tablet Take 1 tablet (5 mg total) by mouth daily.  ? [DISCONTINUED] aspirin EC 81 MG tablet Take 81 mg by mouth daily. Swallow whole.  ? [DISCONTINUED] atenolol (TENORMIN) 50 MG tablet Take 50 mg by mouth daily.  ?  ? ?Allergies:   Hydrocodone and Hydrocodone-acetaminophen  ? ?Social History  ? ?Socioeconomic History  ? Marital status: Married  ?  Spouse name: Not on file  ? Number of children: Not on file  ? Years of education: Not on file  ? Highest education level: Not on file  ?Occupational History  ? Not on file  ?Tobacco Use  ? Smoking status: Never  ?  Passive exposure: Never  ? Smokeless tobacco: Never  ?Vaping Use  ? Vaping Use: Never used  ?Substance and Sexual Activity  ? Alcohol use: No  ? Drug use: No  ? Sexual activity: Not on file  ?Other Topics Concern  ? Not on file  ?Social History Narrative  ? Not on file  ? ?Social Determinants of Health  ? ?Financial Resource Strain: Not on file  ?Food Insecurity: Not on file  ?Transportation Needs: Not on file  ?Physical Activity: Not on file  ?Stress: Not on file  ?Social Connections: Not on file  ?  ? ?Family History: ?The patient's family  history includes Diabetes in his sister; Heart attack in his mother; Hypertension in his mother and sister; Stomach cancer in his father. ?ROS:   ?Please see the history of present illness.    ?All other systems reviewed and are negative. ? ?EKGs/Labs/Other Studies Reviewed:   ? ?The following studies were reviewed today: ? ?EKG:  EKG ordered today and personally reviewed.  The ekg ordered today demonstrates sinus rhythm  54 bpm normal EKG ? ?Recent Labs: ?No results found for requested labs within last 8760 hours.  ?Recent Lipid Panel ?No results found for: CHOL, TRIG, HDL, CHOLHDL, VLDL, LDLCALC, LDLDIRECT ? ?Physical Exam:   ? ?VS:  BP (!) 158/80 (BP Location: Right Arm)   Pulse (!) 54   Ht '5\' 11"'$  (1.803 m)   Wt 219 lb (99.3 kg)   SpO2 96%   BMI 30.54 kg/m?    ? ?Wt Readings from Last 3 Encounters:  ?04/09/21 219 lb (99.3 kg)  ?03/20/20 231 lb 6.4 oz (105 kg)  ?02/25/20 235 lb (106.6 kg)  ?  ? ?GEN:  Well nourished, well developed in no acute distress ?HEENT: Normal ?NECK: No JVD; No carotid bruits ?LYMPHATICS: No lymphadenopathy ?CARDIAC: RRR, no murmurs, rubs, gallops ?RESPIRATORY:  Clear to auscultation without rales, wheezing or rhonchi  ?ABDOMEN: Soft, non-tender, non-distended ?MUSCULOSKELETAL:  No edema; No deformity  ?SKIN: Warm and dry ?NEUROLOGIC:  Alert and oriented x 3 ?PSYCHIATRIC:  Normal affect  ? ? ?Signed, ?[name], MD  ?04/09/2021 3:36 PM    ?[place]  ?

## 2021-04-09 NOTE — Patient Instructions (Signed)
Medication Instructions:  ?Your physician has recommended you make the following change in your medication:  ?Take Lopressor 25 mg once daily in early am ?Take Eliquis 5 mg two times daily ?Discontinue Aspirin 81 mg ? ?*If you need a refill on your cardiac medications before your next appointment, please call your pharmacy* ? ? ?Lab Work: ?NONE ?If you have labs (blood work) drawn today and your tests are completely normal, you will receive your results only by: ?MyChart Message (if you have MyChart) OR ?A paper copy in the mail ?If you have any lab test that is abnormal or we need to change your treatment, we will call you to review the results. ? ? ?Testing/Procedures: ?Your physician has requested that you have an echocardiogram. Echocardiography is a painless test that uses sound waves to create images of your heart. It provides your doctor with information about the size and shape of your heart and how well your heart?s chambers and valves are working. This procedure takes approximately one hour. There are no restrictions for this procedure.  ? ? ?Follow-Up: ?At [facility], you and your health needs are our priority.  As part of our continuing mission to provide you with exceptional heart care, we have created designated Provider Care Teams.  These Care Teams include your primary Cardiologist (physician) and Advanced Practice Providers (APPs -  Physician Assistants and Nurse Practitioners) who all work together to provide you with the care you need, when you need it. ? ?We recommend signing up for the patient portal called "MyChart".  Sign up information is provided on this After Visit Summary.  MyChart is used to connect with patients for Virtual Visits (Telemedicine).  Patients are able to view lab/test results, encounter notes, upcoming appointments, etc.  Non-urgent messages can be sent to your provider as well.   ?To learn more about what you can do with MyChart, go to [email or web address].    ? ?Your next appointment:   ?6 week(s) ? ?The format for your next appointment:   ?In Person ? ?Provider:   ?[name], MD  ? ? ?Other Instructions ?  ?

## 2021-04-09 NOTE — Telephone Encounter (Signed)
Spoke with pt's wife and clarified that pt needs to stop taking the Tenormin since Dr Bettina Gavia put him on Lopressor 25 mg daily. She verbalized understanding and wrote it down while we were on the phone. She verified that pt stops Aspirin and Tenormin and starts Eliquis and Lopressor. ?

## 2021-04-15 ENCOUNTER — Ambulatory Visit (INDEPENDENT_AMBULATORY_CARE_PROVIDER_SITE_OTHER): Payer: PPO

## 2021-04-15 DIAGNOSIS — I2510 Atherosclerotic heart disease of native coronary artery without angina pectoris: Secondary | ICD-10-CM

## 2021-04-15 DIAGNOSIS — I480 Paroxysmal atrial fibrillation: Secondary | ICD-10-CM

## 2021-04-15 DIAGNOSIS — I10 Essential (primary) hypertension: Secondary | ICD-10-CM

## 2021-04-15 DIAGNOSIS — Q245 Malformation of coronary vessels: Secondary | ICD-10-CM | POA: Diagnosis not present

## 2021-04-15 LAB — ECHOCARDIOGRAM COMPLETE
Area-P 1/2: 2.62 cm2
S' Lateral: 3.5 cm

## 2021-04-16 ENCOUNTER — Telehealth: Payer: Self-pay

## 2021-04-16 ENCOUNTER — Other Ambulatory Visit: Payer: Self-pay

## 2021-04-16 MED ORDER — MULTAQ 400 MG PO TABS: 400.0000 mg | ORAL_TABLET | Freq: Two times a day (BID) | ORAL | 3 refills | Status: DC

## 2021-04-16 NOTE — Telephone Encounter (Signed)
Called the patient's wife and she had a question as to whether they should hold the Eliquis since the patient was going to be having a back injection. I asked Dr. Bettina Gavia and he told me to have him hold the the Eliquis three days before the injection and two days after the injection. I informed the patient's wife of this information.  ?

## 2021-04-17 ENCOUNTER — Telehealth: Payer: Self-pay | Admitting: Cardiology

## 2021-04-17 NOTE — Telephone Encounter (Signed)
Spoke with [place] and gave recommendation of Dr. Bettina Gavia. ?

## 2021-04-17 NOTE — Telephone Encounter (Signed)
Pt c/o medication issue: ? ?1. Name of Medication:  ?dronedarone (MULTAQ) 400 MG tablet ? ?2. How are you currently taking this medication (dosage and times per day)?  ? ?3. Are you having a reaction (difficulty breathing--STAT)?  ? ?4. What is your medication issue?  ? ?Sam with [facility] states they received Rx for Multaq, but it will cause and interaction with Escitalopram. He would like to know how to proceed prior to distributing the medication to the patient.  ?

## 2021-04-21 ENCOUNTER — Ambulatory Visit: Payer: PPO

## 2021-04-22 ENCOUNTER — Telehealth: Payer: Self-pay

## 2021-04-22 NOTE — Telephone Encounter (Signed)
? ?  Pre-operative Risk Assessment  ?  ?Patient Name: Miguel Morgan  ?DOB: 1940/01/21 ?MRN: [id number]  ? ?  ? ?Request for Surgical Clearance   ? ?Procedure:   ESI L3-L4 ? ?Date of Surgery:  Clearance TBD                              ?   ?Surgeon:  Andres Ege, MD ?Surgeon's Group or Practice Name:  [facility] ?Phone number:  ?Fax number:  [phone] ?  ?Type of Clearance Requested:   ?- Pharmacy:  Hold Apixaban (Eliquis) 3 days prior to procedure ?  ?Type of Anesthesia:  Not Indicated ?  ?Additional requests/questions:   ? ?Signed, ?Tyesha Joffe Tressa Busman   ?04/22/2021, 2:37 PM  ? ?

## 2021-04-23 NOTE — Telephone Encounter (Signed)
? ?  Patient Name: Miguel Morgan  ?DOB: 05-21-1940 ?MRN: [id number] ? ?Primary Cardiologist: Dr. Bettina Gavia ? ?Chart reviewed as part of pre-operative protocol coverage. Per 04/16/21 phone note from our clinic nurse Edwyna Shell, "Called the patient's wife and she had a question as to whether they should hold the Eliquis since the patient was going to be having a back injection. I asked Dr. Bettina Gavia and he told me to have him hold the the Eliquis three days before the injection and two days after the injection. I informed the patient's wife of this information." ? ?Will route this bundled recommendation to requesting provider via Epic fax function. Please call with questions. ? ?Charlie Pitter, PA-C ?04/23/2021, 12:59 PM ? ? ?

## 2021-04-24 ENCOUNTER — Ambulatory Visit (INDEPENDENT_AMBULATORY_CARE_PROVIDER_SITE_OTHER): Payer: PPO

## 2021-04-24 VITALS — BP 140/74 | HR 56 | Ht 71.0 in | Wt 221.0 lb

## 2021-04-24 DIAGNOSIS — Z79899 Other long term (current) drug therapy: Secondary | ICD-10-CM

## 2021-04-24 NOTE — Progress Notes (Signed)
? ?  Nurse Visit  ? ?Date of Encounter: 04/24/2021 ?ID: Miguel Morgan, DOB [birth year], MRN [id number] ? ?PCP:  [name], MD ?  ?[facility] Providers ?Cardiologist:  Dr. Bettina Gavia ?}   ? ? ?Visit Details  ? ?VS:  BP 140/74 (BP Location: Left Arm, Patient Position: Sitting, Cuff Size: Normal)   Pulse (!) 56   Ht '5\' 11"'$  (1.803 m)   Wt 221 lb (100.2 kg)   SpO2 96%   BMI 30.82 kg/m?  , BMI Body mass index is 30.82 kg/m?. ? ?Wt Readings from Last 3 Encounters:  ?04/24/21 221 lb (100.2 kg)  ?04/09/21 219 lb (99.3 kg)  ?03/20/20 231 lb 6.4 oz (105 kg)  ?  ? ?Reason for visit: Patient started on Multaq  and EKG performed ?Performed today: Vitals, EKG, Provider consulted and Education ?Changes (medications, testing, etc.) : No new orders ?Length of Visit: 20 minutes ? ? ? ?Medications Adjustments/Labs and Tests Ordered: ?Orders Placed This Encounter  ?Procedures  ? EKG 12-Lead  ? ?No orders of the defined types were placed in this encounter. ? ? ? ?Signed, ?Louie Casa, RN  ?04/24/2021 10:55 AM ? ?

## 2021-04-29 ENCOUNTER — Telehealth: Payer: Self-pay | Admitting: Cardiology

## 2021-04-29 NOTE — Telephone Encounter (Signed)
Called the patients wife and she reported that the patient started having the following symptoms: ? ?"Not sleeping, depression, tired, weak, dark urine with frequent urination, lethargic; patient is usually very active" ? ?Discussed these symptoms with Dr. Bettina Gavia and he recommended that the patient stop the Multaq and follow up with his PCP regarding the dark urine. I informed the patient's wife and she was agreeable with this plan of care and had no further questions. ?

## 2021-04-29 NOTE — Telephone Encounter (Signed)
Pt c/o medication issue: ? ?1. Name of Medication: dronedarone (MULTAQ) 400 MG tablet ? ?2. How are you currently taking this medication (dosage and times per day)? Take 1 tablet (400 mg total) by mouth 2 (two) times daily with a meal. ? ?3. Are you having a reaction (difficulty breathing--STAT)? Yes ? ?4. What is your medication issue? Not sleeping, depression, tired, weak, dark urine with frequent urination, lethargic; patient is usually very active ?

## 2021-05-21 ENCOUNTER — Ambulatory Visit: Payer: PPO | Admitting: Cardiology

## 2021-05-21 VITALS — BP 148/86 | HR 66 | Ht 71.0 in | Wt 217.0 lb

## 2021-05-21 DIAGNOSIS — E782 Mixed hyperlipidemia: Secondary | ICD-10-CM

## 2021-05-21 DIAGNOSIS — I25118 Atherosclerotic heart disease of native coronary artery with other forms of angina pectoris: Secondary | ICD-10-CM | POA: Diagnosis not present

## 2021-05-21 DIAGNOSIS — I10 Essential (primary) hypertension: Secondary | ICD-10-CM

## 2021-05-21 DIAGNOSIS — I480 Paroxysmal atrial fibrillation: Secondary | ICD-10-CM | POA: Diagnosis not present

## 2021-05-21 DIAGNOSIS — Z7901 Long term (current) use of anticoagulants: Secondary | ICD-10-CM | POA: Diagnosis not present

## 2021-05-21 MED ORDER — METOPROLOL TARTRATE 25 MG PO TABS: 25.0000 mg | ORAL_TABLET | Freq: Two times a day (BID) | ORAL | 3 refills | Status: DC

## 2021-05-21 NOTE — Patient Instructions (Signed)
Medication Instructions:  Your physician has recommended you make the following change in your medication:   START: Metoprolol tartrate 25 mg twice daily  *If you need a refill on your cardiac medications before your next appointment, please call your pharmacy*   Lab Work: None If you have labs (blood work) drawn today and your tests are completely normal, you will receive your results only by: [place] (if you have MyChart) OR A paper copy in the mail If you have any lab test that is abnormal or we need to change your treatment, we will call you to review the results.   Testing/Procedures: None   Follow-Up: At [facility], you and your health needs are our priority.  As part of our continuing mission to provide you with exceptional heart care, we have created designated Provider Care Teams.  These Care Teams include your primary Cardiologist (physician) and Advanced Practice Providers (APPs -  Physician Assistants and Nurse Practitioners) who all work together to provide you with the care you need, when you need it.  We recommend signing up for the patient portal called "MyChart".  Sign up information is provided on this After Visit Summary.  MyChart is used to connect with patients for Virtual Visits (Telemedicine).  Patients are able to view lab/test results, encounter notes, upcoming appointments, etc.  Non-urgent messages can be sent to your provider as well.   To learn more about what you can do with MyChart, go to [email or web address].    Your next appointment:   6 month(s)  The format for your next appointment:   In Person  Provider:   [name], MD    Other Instructions Get Apple watch and turn on A-fib detection  Important Information About Sugar         WATCHMAN offers an alternative to the lifelong use of warfarin for people with atrial fibrillation not caused by a heart valve problem (also known as non-valvular AFib).  This permanent heart  implant effectively reduces the risk of stroke--without the risk of bleeding that can come with the long-term use of warfarin (the most common blood thinner).1,2 What's more, WATCHMAN can eliminate the regular blood tests and food-and-drink restrictions that come with warfarin. (Warfarin is also known as Coumadin.)  In a clinical trial, 9 out of 10 people were able to stop taking warfarin just 45 days after the [facility] procedure.1  How WATCHMAN Works  To understand how WATCHMAN works, it helps to know more about the connection between atrial fibrillation and stroke.  Atrial fibrillation, or AFib, affects your heart's ability to pump blood normally. This can cause blood to pool in an area of the heart called the left atrial appendage, or LAA. There, blood cells can stick together and form a clot. When a blood clot escapes from the LAA and travels to another part of the body, it can cut off the blood supply to the brain, causing a stroke.3,4  In people with AFib not caused by a heart valve problem, more than 90% of stroke-causing clots that come from the heart are formed in the LAA.3 That's why closing off this part of the heart is an effective way to reduce stroke risk.  The [facility] Implant fits right into your LAA. It's designed to permanently close it off and keep those blood clots from escaping. WATCHMAN is about the size of a quarter and made from very light and compact materials commonly used in many other medical implants.  The [facility] Procedure  WATCHMAN is implanted into your heart in a one-time procedure. It's a permanent device that doesn't have to be replaced and can't be seen outside the body.  To implant WATCHMAN, your doctor makes a small cut in your upper leg and inserts a narrow tube, as done in a standard stent procedure. Your doctor then guides WATCHMAN into the left atrial appendage (LAA) of your heart. The procedure is done under general anesthesia and takes about an hour. Patients  commonly stay in the hospital overnight and leave the next day.  Due to the risk of having a medical procedure, patients should not be considered for WATCHMAN if they are doing well and expect to continue doing well on blood thinners.  After the Procedure  Following the WATCHMAN procedure, you'll take warfarin (a common blood thinner) for 45 days or until your LAA is permanently closed off. During this time, heart tissue will grow over the implant to form a barrier against blood clots. Your doctor will monitor this process by taking pictures of your heart to see when you can stop taking warfarin.  Your doctor will then prescribe a medicine called clopidogrel (also known as Plavix) and aspirin for you to take for 6 months. After that, you'll continue to take aspirin on an ongoing basis. A very small number of patients may need to keep taking blood thinners long term.  In a clinical trial:  92% of patients were able to stop taking warfarin just 45 days after the procedure1  99% of patients were able to stop taking warfarin within 1 year after the procedure1   Dr Lars Mage in [place]

## 2021-05-21 NOTE — Progress Notes (Addendum)
Cardiology Office Note:    Date:  05/21/2021   ID:  Miguel Morgan, DOB September 14, 1940, MRN [id number]  PCP:  [name], MD  Cardiologist:  [name], MD     Referring MD: [name], MD    ASSESSMENT:    1. Paroxysmal atrial fibrillation (HCC)   2. Chronic anticoagulation   3. Coronary artery disease involving native coronary artery of native heart with other form of angina pectoris ([place])   4. Primary hypertension   5. Mixed hyperlipidemia    PLAN:    In order of problems listed above:  Unfortunately intolerant and during interim I am unsure what his atrial fibrillation burden is I asked him to utilize an Apple Watch we will increase his beta-blocker to twice daily and if he has ongoing symptoms ask EP to decide whether we admit him to the hospital to initiate antiarrhythmic therapy with sotalol or consider atrial fibrillation ablation. He has kidney stones ongoing hematuria would be ideal for watchman refer to Dr. Lars Mage Stable CAD continue medical treatment Continue present treatment for hypertension hyperlipidemia  .[facility] Referral for Left Atrial Appendage Closure with Non-Valvular Atrial Fibrillation   Miguel Morgan is a 81 y.o. male is being referred to the [facility] Team for evaluation for Left Atrial Appendage Closure with Watchman device for the management of stroke risk resulting form non-valvular atrial fibrillation.    Base upon 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  The patient has a HAS-BLED score of  3  His CHADS2-VASc Score is 3 with   His stroke risk necessitates3detailed review of a strategy of stroke prevention with either long-term oral anticoagulation or left atrial appendage occlusion therapy. We have discussed their  bleeding risk in the context of their comorbid medical problems, as well as the rationale for referral for evaluation of Watchman left atrial appendage occlusion therapy. While the patient is at high long-term bleeding risk, they may be appropriate for short-term anticoagulation. Based on this individual patient's stroke and bleeding risk, a shared decision has been made to refer the patient for consideration of Watchman left atrial appendage closure utilizing the [employer] of Cardiology shared decision tool.  Next appointment: 6 months   Medication Adjustments/Labs and Tests Ordered: Current medicines are reviewed at length with the patient today.  Concerns regarding medicines are outlined above.  Orders Placed This Encounter  Procedures   Ambulatory referral to Cardiology   Meds ordered this encounter  Medications   metoprolol tartrate (LOPRESSOR) 25 MG tablet    Sig: Take 1 tablet (25 mg total) by mouth 2 (two) times daily.    Dispense:  180 tablet    Refill:  3    Chief Complaint  Patient presents with   Follow-up   Atrial Fibrillation    History of Present Illness:    Miguel Morgan is a 81 y.o. 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  Prior to that visit he had utilized an event monitor showing 2% incidence of atrial fibrillation with rapid response.  He was transition from aspirin to Eliquis for stroke prophylaxis.  He is also placed on a beta-blocker.  Compliance with diet,  lifestyle and medications: Yes  His wife is present she has very good health.  Knowledge She recommends I agree to purchase an Apple Watch to monitor his rhythm He continues to have episodes particularly at night or early in the morning where his heart will race briefly We discussed options antiarrhythmic drug therapy but complex with his CAD I  think the only drugs I would recommend will either be sotalol or amiodarone we will reduce the increase his Lopressor to twice daily and monitor his rhythm and make a decision. The other option be referral for atrial fibrillation ablation if documented recurrence He has ongoing hematuria was seen by urology and I think he is a good candidate for Watchman and his wife requested evaluation daily.  Patient echocardiogram performed 04/15/2021. Left ventricle normal in size wall thickness ejection fraction 60 to 72% however diastolic dysfunction seen with elevated filling pressure and left atrial pressure.  Right ventricle is normal size and function.  There is no significant valvular abnormality    1. GLS -15.6. Left ventricular ejection fraction, by estimation, is 60 to  65%. The left ventricle has normal function. The left ventricle has no  regional wall motion abnormalities. Left ventricular diastolic parameters  are consistent with Grade II  diastolic dysfunction (pseudonormalization).   2. Right ventricular systolic function is normal. The right ventricular  size is normal.   3. The mitral valve is normal in structure. No evidence of mitral valve  regurgitation. No evidence of mitral stenosis.   4. The aortic valve is normal in structure. Aortic valve regurgitation is  not visualized. Aortic valve sclerosis/calcification is present, without  any evidence of aortic stenosis.   5. The inferior vena cava is normal in size with greater than 50%  respiratory variability, suggesting right atrial pressure of 3 mmHg.  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 Normal EF.    CARPAL TUNNEL RELEASE Right    cataract surgery Bilateral    with lens implant   COLONOSCOPY     HEMORRHOID SURGERY     KNEE SURGERY     right - partial knee replacement   LITHOTRIPSY     LUMBAR DISC SURGERY  04/15/2010   L2-3 central decompression, L4-5 left decompression. Dr. Lang Snow   TOTAL KNEE ARTHROPLASTY Left 06/09/2015   Procedure: TOTAL KNEE ARTHROPLASTY;  Surgeon: Vickey Huger, MD;  Location: [place];  Service: Orthopedics;  Laterality: Left;   UMBILICAL HERNIA REPAIR     VASECTOMY      Current Medications: Current Meds  Medication Sig   apixaban (ELIQUIS) 5 MG TABS tablet Take 1 tablet (5 mg total) by mouth  2 (two) times daily.   Ascorbic Acid (VITAMIN C) 1000 MG tablet Take 1,000 mg by mouth daily.   cholecalciferol (VITAMIN D3) 25 MCG (1000 UNIT) tablet Take 1,000 Units by mouth daily.   Cyanocobalamin (VITAMIN B-12 PO) Take 5,000 mcg by mouth daily.   diltiazem (TIAZAC) 360 MG 24 hr capsule Take 360 mg by mouth daily.   escitalopram (LEXAPRO) 10 MG tablet Take 10 mg by mouth daily.   finasteride (PROSCAR) 5 MG tablet Take 5 mg by mouth daily.   levothyroxine (SYNTHROID) 150 MCG tablet Take 1 tablet (150 mcg total) by mouth daily.   losartan (COZAAR) 100 MG tablet Take 1 tablet (100 mg total) by mouth daily.   metoprolol tartrate (LOPRESSOR) 25 MG tablet Take 1 tablet (25 mg total) by mouth 2 (two) times daily.   nitroGLYCERIN (NITROSTAT) 0.4 MG SL tablet Place 0.4 mg under the tongue every 5 (five) minutes as needed for chest pain.   omeprazole (PRILOSEC) 40 MG capsule Take 40 mg  by mouth daily.   rosuvastatin (CRESTOR) 5 MG tablet Take 1 tablet (5 mg total) by mouth daily.   [DISCONTINUED] metoprolol tartrate (LOPRESSOR) 25 MG tablet Take 1 tablet (25 mg total) by mouth daily.     Allergies:   Hydrocodone and Hydrocodone-acetaminophen   Social History   Socioeconomic History   Marital status: Married    Spouse name: Not on file   Number of children: Not on file   Years of education: Not on file   Highest education level: Not on file  Occupational History   Not on file  Tobacco Use   Smoking status: Never    Passive exposure: Never   Smokeless tobacco: Never  Vaping Use   Vaping Use: Never used  Substance and Sexual Activity   Alcohol use: No   Drug use: No   Sexual activity: Not on file  Other Topics Concern   Not on file  Social History Narrative   Not on file   Social Determinants of Health   Financial Resource Strain: Not on file  Food Insecurity: Not on file  Transportation Needs: Not on file  Physical Activity: Not on file  Stress: Not on file  Social Connections: Not on file     Family History: The patient's UAfamily history includes Diabetes in his sister; Heart attack in his mother; Hypertension in his mother and sister; Stomach cancer in his father. ROS:   Please see the history of present illness.    All other systems reviewed and are negative.  EKGs/Labs/Other Studies Reviewed:    The following studies were reviewed today:    Recent Labs: No results found for requested labs within last 8760 hours.  Recent Lipid Panel No results found for: CHOL, TRIG, HDL, CHOLHDL, VLDL, LDLCALC, LDLDIRECT  Physical Exam:    VS:  BP (!) 148/86 (BP Location: Right Arm)   Pulse 66   Ht '5\' 11"'$  (1.803 m)   Wt 217 lb (98.4 kg)   SpO2 97%   BMI 30.27 kg/m     Wt Readings from Last 3 Encounters:  05/21/21 217 lb (98.4 kg)  04/24/21 221 lb (100.2 kg)  04/09/21 219 lb (99.3 kg)     GEN:  Well nourished, well developed in no acute  distress HEENT: Normal NECK: No JVD; No carotid bruits LYMPHATICS: No lymphadenopathy CARDIAC: RRR, no murmurs, rubs, gallops RESPIRATORY:  Clear to auscultation without rales, wheezing or rhonchi  ABDOMEN: Soft, non-tender, non-distended MUSCULOSKELETAL:  No edema;  No deformity  SKIN: Warm and dry NEUROLOGIC:  Alert and oriented x 3 PSYCHIATRIC:  Normal affect    Signed, [name], MD  05/21/2021 10:45 AM    [place]

## 2021-06-03 ENCOUNTER — Telehealth: Payer: Self-pay

## 2021-06-03 NOTE — Telephone Encounter (Signed)
The patient was previously scheduled for [facility] consult with Dr. Quentin Ore 06/18/2021. His wife called today and requested to cancel the appointment, stating he has "too many other bigger issues going on right now."  Offered to postpone the consult, but she declined. She will call back if he wishes to proceed with consult. She was grateful for assistance.

## 2021-06-18 ENCOUNTER — Other Ambulatory Visit: Payer: Self-pay | Admitting: Urology

## 2021-06-18 ENCOUNTER — Ambulatory Visit: Payer: PPO | Admitting: Cardiology

## 2021-06-18 ENCOUNTER — Telehealth: Payer: Self-pay | Admitting: Cardiology

## 2021-06-18 NOTE — Telephone Encounter (Signed)
   Pre-operative Risk Assessment    Patient Name: Miguel Morgan  DOB: Aug 07, 1940 MRN: [id number]      Request for Surgical Clearance    Procedure:   Ureteroscopy   Date of Surgery:  Clearance 07/17/21                                 Surgeon:  Dr. Irine Seal Surgeon's Group or Practice Name:  [facility] Phone number:  [phone] Ext. [phone] Fax number:  [phone]   Type of Clearance Requested:   - Medical  - Pharmacy:  Hold Apixaban (Eliquis) 2 days prior   Type of Anesthesia:  General    Additional requests/questions:  Please fax a copy of medical clearance to the surgeon's office.  Romilda Garret   06/18/2021, 4:27 PM

## 2021-06-19 NOTE — Telephone Encounter (Signed)
Patient with diagnosis of Afib on Eliquis for anticoagulation.    Procedure: Ureteroscopy Date of procedure: 07/17/21   CHA2DS2-VASc Score = 4  This indicates a 4.8% annual risk of stroke. The patient's score is based upon: CHF History: 0 HTN History: 1 Diabetes History: 0 Stroke History: 0 Vascular Disease History: 1 Age Score: 2 Gender Score: 0   CrCl 65.1 using AdjBW Platelet count 150  Per office protocol, patient can hold Eliquis for 2 days prior to procedure as requested.

## 2021-06-23 NOTE — Telephone Encounter (Signed)
Dr. Bettina Gavia From your last note, it sounds like you were going to discuss with EP next steps. We have been asked for clearance for ureteroscopy. I believe he will need an in-office visit for risk evaluation.  Does he need to see you back or EP prior to this procedure?

## 2021-06-24 NOTE — Telephone Encounter (Signed)
   Name: Miguel Morgan  DOB: 02/28/1940  MRN: [id number]   Primary Cardiologist: [name], MD  Chart reviewed as part of pre-operative protocol coverage. Per Dr. Bettina Gavia: patient is pending evaluation with EP for consideration of watchman device and ablation. He may proceed with ureteroscopy and may hold eliquis for 2 days prior to procedure.   Please resume after when safe.   I will route this recommendation to the requesting party via Epic fax function and remove from pre-op pool. Please call with questions.  Tami Lin Graceann Boileau, PA 06/24/2021, 8:11 AM

## 2021-07-01 NOTE — Patient Instructions (Addendum)
DUE TO COVID-19 ONLY TWO VISITORS  (aged 79 and older)  ARE ALLOWED TO COME WITH YOU AND STAY IN THE WAITING ROOM ONLY DURING PRE OP AND PROCEDURE.   **NO VISITORS ARE ALLOWED IN THE SHORT STAY AREA OR RECOVERY ROOM!!**  IF YOU WILL BE ADMITTED INTO THE HOSPITAL YOU ARE ALLOWED ONLY FOUR SUPPORT PEOPLE DURING VISITATION HOURS ONLY (7 AM -8PM)   The support person(s) must pass our screening, gel in and out, and wear a mask at all times, including in the patient's room. Patients must also wear a mask when staff or their support person are in the room. Visitors GUEST BADGE MUST BE WORN VISIBLY  One adult visitor may remain with you overnight and MUST be in the room by 8 P.M.     Your procedure is scheduled on: 07/17/21   Report to [facility] Main Entrance    Report to admitting at  8:00  AM   Call this number if you have problems the morning of surgery [phone]   Do not eat food or drink :After Midnight.             If you have questions, please contact your surgeon's office.    Oral Hygiene is also important to reduce your risk of infection.                                    Remember - BRUSH YOUR TEETH THE MORNING OF SURGERY WITH YOUR REGULAR TOOTHPASTE    Take these medicines the morning of surgery with A SIP OF WATER: Lexapro, Metoprolol, Diltiazem, Levothyroxine, Omeprazole   Before surgery.Stop taking _Eliquis__________on __7/12________as instructed by _Dr Munley____________.     Bring CPAP mask and tubing day of surgery.                              You may not have any metal on your body including jewelry, and body piercing             Do not wear lotions, powders, perfumes/cologne, or deodorant                Men may shave face and neck.   Do not bring valuables to the hospital. [place].   Contacts, dentures or bridgework may not be worn into surgery.   Bring small overnight bag day of surgery.       Patients discharged on the day of surgery will not be allowed to drive home.  Someone NEEDS to stay with you for the first 24 hours after anesthesia.   Special Instructions: Bring a copy of your healthcare power of attorney and living will documents  the day of surgery if you haven't scanned them before.              Please read over the following fact sheets you were given: IF YOU HAVE QUESTIONS ABOUT YOUR PRE-OP INSTRUCTIONS PLEASE CALL [phone]     [facility] - Preparing for Surgery Before surgery, you can play an important role.  Because skin is not sterile, your skin needs to be as free of germs as possible.  You can reduce the number of germs on your skin by washing with CHG (chlorahexidine gluconate) soap before surgery.  CHG is an antiseptic cleaner which kills germs and bonds with the skin to continue killing germs even after washing. Please DO NOT use if you have an allergy to CHG or antibacterial soaps.  If your skin becomes reddened/irritated stop using the CHG and inform your nurse when you arrive at Short Stay..  You may shave your face/neck. Please follow these instructions carefully:  1.  Shower with CHG Soap the night before surgery and the  morning of Surgery.  2.  If you choose to wash your hair, wash your hair first as usual with your  normal  shampoo.  3.  After you shampoo, rinse your hair and body thoroughly to remove the  shampoo.                            4.  Use CHG as you would any other liquid soap.  You can apply chg directly  to the skin and wash                       Gently with a scrungie or clean washcloth.  5.  Apply the CHG Soap to your body ONLY FROM THE NECK DOWN.   Do not use on face/ open                           Wound or open sores. Avoid contact with eyes, ears mouth and genitals (private parts).                       Wash face,  Genitals (private parts) with your normal soap.             6.  Wash thoroughly, paying special attention to the  area where your surgery  will be performed.  7.  Thoroughly rinse your body with warm water from the neck down.  8.  DO NOT shower/wash with your normal soap after using and rinsing off  the CHG Soap.                9.  Pat yourself dry with a clean towel.            10.  Wear clean pajamas.            11.  Place clean sheets on your bed the night of your first shower and do not  sleep with pets. Day of Surgery : Do not apply any lotions/deodorants the morning of surgery.  Please wear clean clothes to the hospital/surgery center.  FAILURE TO FOLLOW THESE INSTRUCTIONS MAY RESULT IN THE CANCELLATION OF YOUR SURGERY    ________________________________________________________________________

## 2021-07-08 ENCOUNTER — Encounter (HOSPITAL_COMMUNITY): Payer: Self-pay

## 2021-07-08 ENCOUNTER — Other Ambulatory Visit: Payer: Self-pay

## 2021-07-08 ENCOUNTER — Encounter (HOSPITAL_COMMUNITY)
Admission: RE | Admit: 2021-07-08 | Discharge: 2021-07-08 | Disposition: A | Discharge status: Home / Self Care | Payer: PPO | Source: Ambulatory Visit | Attending: Urology | Admitting: Urology

## 2021-07-08 DIAGNOSIS — I10 Essential (primary) hypertension: Secondary | ICD-10-CM | POA: Diagnosis not present

## 2021-07-08 DIAGNOSIS — Z01812 Encounter for preprocedural laboratory examination: Secondary | ICD-10-CM | POA: Diagnosis present

## 2021-07-08 HISTORY — DX: Personal history of urinary calculi: Z87.442

## 2021-07-08 HISTORY — DX: Unspecified atrial fibrillation: I48.91

## 2021-07-08 LAB — BASIC METABOLIC PANEL
Anion gap: 5 (ref 5–15)
BUN: 17 mg/dL (ref 8–23)
CO2: 28 mmol/L (ref 22–32)
Calcium: 9.9 mg/dL (ref 8.9–10.3)
Chloride: 110 mmol/L (ref 98–111)
Creatinine, Ser: 1.01 mg/dL (ref 0.61–1.24)
GFR, Estimated: >60 mL/min (ref >60)
Glucose, Bld: 114 mg/dL — ABNORMAL HIGH (ref 70–99)
Potassium: 4.4 mmol/L (ref 3.5–5.1)
Sodium: 143 mmol/L (ref 135–145)

## 2021-07-08 LAB — CBC
HCT: 43.4 % (ref 39.0–52.0)
Hemoglobin: 14.1 g/dL (ref 13.0–17.0)
MCH: 29.5 pg (ref 26.0–34.0)
MCHC: 32.5 g/dL (ref 30.0–36.0)
MCV: 90.8 fL (ref 80.0–100.0)
Platelets: 201 10*3/uL (ref 150–400)
RBC: 4.78 MIL/uL (ref 4.22–5.81)
RDW: 13 % (ref 11.5–15.5)
WBC: 9.1 10*3/uL (ref 4.0–10.5)
nRBC: 0 % (ref 0.0–0.2)

## 2021-07-08 NOTE — Progress Notes (Signed)
Anesthesia note:  Bowel prep reminder:NA  PCP - Dr. Sarina Ser Cardiologist -Dr. Rinaldo Cloud Other-   Chest x-ray - no EKG - 04/24/21-epic Stress Test - 2004 ECHO - 04/15/21-epic Cardiac Cath - 2011  Pacemaker/ICD device last checked:NA  Sleep Study - yes CPAP - yes  Pt is pre diabetic-NA Fasting Blood Sugar -  Checks Blood Sugar _____  Blood Thinner:Eliquis for A-Fib/ Dr. Bettina Gavia Blood Thinner Instructions:hold 2 days prior to DOS Dr. Bettina Gavia Aspirin Instructions: Last Dose:07/14/21  Anesthesia review:   Patient denies shortness of breath, fever, cough and chest pain at PAT appointment Pt has not has SOB with activities recently   Patient verbalized understanding of instructions that were given to them at the PAT appointment. Patient was also instructed that they will need to review over the PAT instructions again at home before surgery. Yes. Wife was with him at PAT.Pt is very [facility]

## 2021-07-10 NOTE — Progress Notes (Addendum)
Anesthesia Chart Review   Case: [id number] Date/Time: 07/17/21 0945   Procedure: CYSTOSCOPY WITH RETROGRADE  URETEROSCOPY WITH HOLMIUM LASER AND STENT PLACEMENT (Left)   Anesthesia type: General   Pre-op diagnosis: GROSS HEMATURIA LEFT RENAL STONE   Location: [facility] ROOM 03 / [facility]   Surgeons: Irine Seal, MD       DISCUSSION:81 y.o. never smoker with h/o GERD, a-fib, nonobstructive CAD, gross hematuria, renal stone scheduled for above procedure 07/17/2021 with Dr. Irine Seal.   Per cardiology preoperative evaluation 06/24/21, "Chart reviewed as part of pre-operative protocol coverage. Per Dr. Bettina Gavia: patient is pending evaluation with EP for consideration of watchman device and ablation. He may proceed with ureteroscopy and may hold eliquis for 2 days prior to procedure."  Anticipate pt can proceed with planned procedure barring acute status change.   VS: BP (!) 163/92   Pulse 65   Temp 36.9 C (Oral)   Resp 18   Ht '5\' 11"'$  (1.803 m)   Wt 99.3 kg   SpO2 96%   BMI 30.54 kg/m   PROVIDERS: [name], MD is PCP   [name], MD is Cardiologist  LABS: Labs reviewed: Acceptable for surgery. (all labs ordered are listed, but only abnormal results are displayed)  Labs Reviewed  BASIC METABOLIC PANEL - Abnormal; Notable for the following components:      Result Value   Glucose, Bld 114 (*)    All other components within normal limits  CBC     IMAGES:   EKG: 04/24/21 Rate 56 bpm  Sinus bradycardia  Nonspecific T wave abnormality   CV: Echo 04/15/21  1. GLS -15.6. Left ventricular ejection fraction, by estimation, is 60 to  65%. The left ventricle has normal function. The left ventricle has no  regional wall motion abnormalities. Left ventricular diastolic parameters  are consistent with Grade II  diastolic dysfunction (pseudonormalization).   2. Right ventricular systolic function is normal. The right ventricular  size is normal.   3. The mitral valve is normal in  structure. No evidence of mitral valve  regurgitation. No evidence of mitral stenosis.   4. The aortic valve is normal in structure. Aortic valve regurgitation is  not visualized. Aortic valve sclerosis/calcification is present, without  any evidence of aortic stenosis.   5. The inferior vena cava is normal in size with greater than 50%  respiratory variability, suggesting right atrial pressure of 3 mmHg. 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 Normal EF.    CARPAL TUNNEL RELEASE Right 1984   cataract surgery  2009   with lens implant   COLONOSCOPY     [place]  KNEE SURGERY  2005   right - partial knee replacement   LITHOTRIPSY     LUMBAR DISC SURGERY  04/15/2010   L2-3 central decompression, L4-5 left decompression. Dr. Lang Snow   TOTAL KNEE ARTHROPLASTY Left 06/09/2015   Procedure: TOTAL KNEE ARTHROPLASTY;  Surgeon: Vickey Huger, MD;  Location: [facility];  Service: Orthopedics;  Laterality: Left;   UMBILICAL HERNIA REPAIR  2018   VASECTOMY  1980    MEDICATIONS:  apixaban (ELIQUIS) 5 MG TABS tablet   Ascorbic Acid (VITAMIN C) 1000 MG tablet   Cholecalciferol (VITAMIN D) 125 MCG (5000 UT) CAPS   Cyanocobalamin (VITAMIN B-12) 5000 MCG TBDP   diltiazem (TIAZAC) 360 MG 24  hr capsule   escitalopram (LEXAPRO) 10 MG tablet   finasteride (PROSCAR) 5 MG tablet   levothyroxine (SYNTHROID) 150 MCG tablet   losartan (COZAAR) 100 MG tablet   metoprolol tartrate (LOPRESSOR) 25 MG tablet   nitroGLYCERIN (NITROSTAT) 0.4 MG SL tablet   omeprazole (PRILOSEC) 40 MG capsule   Probiotic Product (PROBIOTIC DAILY PO)   Propylene Glycol (SYSTANE BALANCE) 0.6 % SOLN   rosuvastatin (CRESTOR) 5 MG tablet   No current facility-administered medications for this encounter.     Konrad Felix Ward, PA-C [facility]-Surgical Testing [phone]

## 2021-07-10 NOTE — Anesthesia Preprocedure Evaluation (Addendum)
Anesthesia Evaluation  Patient identified by MRN, date of birth, ID band Patient awake    Reviewed: Allergy & Precautions, NPO status , Patient's Chart, lab work & pertinent test results, reviewed documented beta blocker date and time   Airway Mallampati: II  TM Distance: >3 FB Neck ROM: Full    Dental no notable dental hx. (+) Teeth Intact, Dental Advisory Given   Pulmonary sleep apnea and Continuous Positive Airway Pressure Ventilation ,    Pulmonary exam normal breath sounds clear to auscultation       Cardiovascular hypertension, Pt. on home beta blockers and Pt. on medications + CAD  Normal cardiovascular exam+ dysrhythmias (in eliquis) Atrial Fibrillation  Rhythm:Regular Rate:Normal  EKG: 04/24/21 Rate 56 bpm  Sinus bradycardia  Nonspecific T wave abnormality   CV: Echo 04/15/21 1. GLS -15.6. Left ventricular ejection fraction, by estimation, is 60 to  65%. The left ventricle has normal function. The left ventricle has no  regional wall motion abnormalities. Left ventricular diastolic parameters  are consistent with Grade II  diastolic dysfunction (pseudonormalization).  2. Right ventricular systolic function is normal. The right ventricular  size is normal.  3. The mitral valve is normal in structure. No evidence of mitral valve  regurgitation. No evidence of mitral stenosis.  4. The aortic valve is normal in structure. Aortic valve regurgitation is  not visualized. Aortic valve sclerosis/calcification is present, without  any evidence of aortic stenosis.  5. The inferior vena cava is normal in size with greater than 50%  respiratory variability, suggesting right atrial pressure of 3 mmHg.   Neuro/Psych PSYCHIATRIC DISORDERS Depression negative neurological ROS     GI/Hepatic Neg liver ROS, GERD  ,  Endo/Other  Hypothyroidism   Renal/GU negative Renal ROS  negative genitourinary    Musculoskeletal negative musculoskeletal ROS (+)   Abdominal   Peds  Hematology negative hematology ROS (+)   Anesthesia Other Findings   Reproductive/Obstetrics                           Anesthesia Physical Anesthesia Plan  ASA: 3  Anesthesia Plan: General   Post-op Pain Management: Tylenol PO (pre-op)*   Induction: Intravenous  PONV Risk Score and Plan: 2 and Ondansetron, Dexamethasone and Treatment may vary due to age or medical condition  Airway Management Planned: LMA  Additional Equipment:   Intra-op Plan:   Post-operative Plan: Extubation in OR  Informed Consent: I have reviewed the patients History and Physical, chart, labs and discussed the procedure including the risks, benefits and alternatives for the proposed anesthesia with the patient or authorized representative who has indicated his/her understanding and acceptance.     Dental advisory given  Plan Discussed with: CRNA  Anesthesia Plan Comments:        Anesthesia Quick Evaluation

## 2021-07-16 NOTE — H&P (Signed)
04/10/19: [name] was last seen in our office on 03/08/19 for urolitiasis follow up. He was placed on Ampicillin orally due to a positive urine culture for enteroccocus. He presents today for follow up. He has tolerated the medication course well and denies any adverse reactions. He reports a decrease in his urgency and frequency. he denies hematuria, dysuria, nausea, vomiting, fevers, flankpain and diarrhea. He is on his last day of the antibiotic.   11/08/19: 57-year-old male who presents today with left-sided flank discomfort that radiates into his left groin. He has a significant history of nephro and urolithiasis. Brought with him a stone he passed 1 week ago. He reports his symptoms have not gotten any better after passing the stone. He denies fevers and chills, he denies gross hematuria. He endorses dysuria, frequency, urgency and left flank discomfort. He does have some intermittent nausea. He has not taken anything for pain other than acetaminophen. He reports that this has been helpful. His last CT scan was in 2019.   12/18/19: 81 year old male who presents today for a one-month follow-up after treatment of UTI. He has not passed any stones since he his last office visit. He does have a large stone burden in bilaterally. He denies flank pain or discomfort today. He denies gross hematuria. He denies fevers and chills. Overall he is doing very well.   06/16/20: Miguel Morgan returns today in f/u for his history of stones and UTI's. His UA today has persistent pyuria but he has had no dysuria or hematuria. He has stable stones and renal cysts on [place] today but no hydronephrosis. His IPSS is 5. He is having no flank pain and has passed no stones since his last f/u.   05/18/21: Miguel Morgan returns today with recurrent hematuria. He saw Dr. Lin Landsman in January and was treated for prostatitis with antibiotics. He had near retention and constipation as his symptoms. He had no flank pain or fever. He then got OTC probiotic  in February. He was diagnosed with a fib and was started on Eliquis in 4/23. He had a recurrent UTI with a positive culture and was changed to Cipro based on the results. He has had persistent hematuria since then particularly when he is active. His UA has >60 RBC's today but no WBC. He has no dysuria. He has mild frequency and nocturia 1-2x. He has a good stream and is emptying well. He has known bilateral renal stones with the last imaging a renal US in 6/22 that showed stable cysts and multiple stones.   06/17/21: Miguel Morgan returns today with recurrent hematuria that has worsened since he started Eliquis for afib about 2 months ago. He has some left flank pain at night when he is sleeping. He has no clots. He has dysuria. His IPSS is 7 with frequency. He has nocturia x 1. He has been really tired. He was not anemic in February at the time of his last labs. He had a CT in 5/23 and has stable bilateral renal stones, left > right, and renal cysts. He has a large prostate. He last had cystoscopy in 2019. His UA today has >60 RBC's.     ALLERGIES: OxyCODONE HCl TABS - "broke out in sweat" per pt    MEDICATIONS: Finasteride 5 mg tablet 1 tablet PO Daily  Metoprolol Succinate 25 mg tablet, extended release 24 hr  Atenolol 50 mg tablet Oral  Crestor 5 mg tablet Oral  Diltiazem 24Hr Er (Cd) 360 mg capsule, ext release 24 hr  Eliquis 5 mg tablet  Escitalopram Oxalate  Levothyroxine  Losartan Potassium 100 mg tablet  Omeprazole 40 mg capsule,delayed release Oral  Probiotic  Singulair  Synthroid 150 mcg tablet Oral  Vitamin B-12  Vitamin C  Vitamin D3 25 mcg (1,000 unit) capsule Oral  Zinc     GU PSH: Cystoscopy - 04/05/2017 Cystoscopy Ureteroscopy - 04-05-12 ESWL - April 05, 2012 Rpr Umbil Hern; Reduc < 5 Yr - 2012/04/05       PSH Notes: Nerve Block Transforaminal Epidural,  Cystoscopy With Ureteroscopy, Lithotripsy,  Umbilical Hernia Repair,  Back Surgery,  Arthroscopy 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  Partial rt knee replacement   NON-GU PSH: Knee Arthroscopy; Dx 04-05-12 Knee replacement, Left     GU PMH: BPH w/LUTS, He has a large prostate with mild LUTS but the prostate could be the source of the bleeding, I will try him on finasteride and reviewed the side effects in detail. If the bleeding persists, he may need to hold the eliquis for a few days. I will have him return in a month and will do cystoscopy. - 05/18/2021, Benign prostatic hyperplasia with urinary obstruction, - 2014-04-06 Gross hematuria (Stable), He has persistent hematuria on Eliquis despite treatment of the recent UTI. his stones are stable and non-obstructing and there is no obvious blood product in the collecting system. - 05/18/2021, 05-Apr-2017, He has stable bilateral renal stones and cysts. I am going to have him hold the ASA and will have him return in 2 weeks for a cystoscopy. , 05-Apr-2017, Gross hematuria, - 04/06/14 Renal calculus - 05/18/2021, He has stable stones on RUS today. I will have him return in 1 year with a KUB and renal US. , - 06/16/2020, - 11/08/2019, He has stable left renal stones. I don't see right renal stones today. he will return in a year with a KUB. , 2019-04-06, He has passed a couple of stones and has no further hematuria. He will return in 6 months with a KUB. , 06-Apr-2018, He has had no stone symptoms and his UA is clear. I will repeat a KUB in a year. , 2016/04/05, He passed a stone about 6 weeks ago. , 04/06/2015, Bilateral kidney stones, - 04-06-14, Calculus of kidney, - 04-05-2012 Renal cyst, He has large bilateral simple renal cysts that are stable since the last CT. - 05/18/2021, Bilateral renal cysts are stable. , - 06/16/2020, Renal cysts, acquired, bilateral, - 06-Apr-2014 Weak Urinary Stream - 05/18/2021, 04-05-2016 Chronic cystitis (w/o hematuria), He has persistent pyuria. I will get a culture today but not treat unless he becomes symptomatic. - 06/16/2020 Acute Cystitis/UTI - 12/18/2019, - April 06, 2019 Microscopic hematuria - 12/18/2019, - 11/08/2019, 2019/04/06 LLQ pain - 11/08/2019, Abdominal pain, LLQ (left lower quadrant), - 2014-04-06 Urinary Urgency - 04/06/2019 Elevated PSA, His PSA remains normal and in his prior range. He doesn't need that done again. 04/05/2017, His PSA remains low. He will f/u with a PSA in a year. , 04/05/2016 (Improving), His PSA continues to fall, - April 06, 2015, Elevated prostate specific antigen (PSA), - 05-Apr-2013 Urinary Hesitancy, Hesitancy - 04/06/14 Other microscopic hematuria, Microscopic hematuria - 2014-04-06 Prostate nodule w/o LUTS, Nodular prostate without lower urinary tract symptoms - 04-06-14 Prostate Stones, Calculus of prostate - 06-Apr-2014 Prostate nodule w/ LUTS, Nodular prostate with lower urinary tract symptoms - 04-05-2013      PMH Notes: Barretts esophagus found 02-2018  COVID 10/20.   NON-GU PMH: Encounter  for general adult medical examination without abnormal findings, Encounter for preventive health examination - 2016 Personal history of other diseases of the digestive system, History of diverticulitis of colon - 2016 Personal history of other diseases of the circulatory system, History of hypertension - 2014 Personal history of other diseases of the musculoskeletal system and connective tissue, History of arthritis - 2014 Personal history of other diseases of the nervous system and sense organs, History of sleep apnea - 2014 Personal history of other endocrine, nutritional and metabolic disease, History of hyperthyroidism - 2014, History of hypercholesterolemia, - 2014 Barrett's esophagus without dysplasia    FAMILY HISTORY: Hypertension - Runs In Family   SOCIAL HISTORY: Marital Status: Married Preferred Language: English; Ethnicity: Not Hispanic Or Latino; Race: White Patient's occupation is/was retired.     Notes: Alcohol use, Caffeine use, Father deceased, Mother deceased, Retired, Married, Never a smoker   REVIEW OF SYSTEMS:    GU Review Male:   Patient reports frequent urination, leakage of urine, get up at night to urinate, and  burning/ pain with urination. Patient denies hard to postpone urination, have to strain to urinate , stream starts and stops, erection problems, trouble starting your stream, and penile pain.  Gastrointestinal (Upper):   Patient denies nausea, vomiting, and indigestion/ heartburn.  Gastrointestinal (Lower):   Patient denies diarrhea and constipation.  Constitutional:   Patient denies fever, night sweats, weight loss, and fatigue.  Skin:   Patient denies skin rash/ lesion and itching.  Eyes:   Patient denies blurred vision and double vision.  Ears/ Nose/ Throat:   Patient denies sore throat and sinus problems.  Hematologic/Lymphatic:   Patient denies swollen glands and easy bruising.  Cardiovascular:   Patient denies leg swelling and chest pains.  Respiratory:   Patient denies cough and shortness of breath.  Endocrine:   Patient denies excessive thirst.  Musculoskeletal:   Patient denies back pain and joint pain.  Neurological:   Patient denies headaches and dizziness.  Psychologic:   Patient denies depression and anxiety.   Notes: Urgency, blood in urine    VITAL SIGNS: None   MULTI-SYSTEM PHYSICAL EXAMINATION:    Constitutional: Well-nourished. No physical deformities. Normally developed. Good grooming.  Respiratory: Normal breath sounds. No labored breathing, no use of accessory muscles.   Cardiovascular: Regular rate and rhythm. No murmur, no gallop.   Lymphatic: No enlargement, no tenderness of axillae, groin, neck lymph nodes.     Complexity of Data:  Records Review:   AUA Symptom Score, Previous Patient Records  Urine Test Review:   Urinalysis  X-Ray Review: KUB: Reviewed Films. Discussed With Patient.     11/01/18 10/31/17 09/22/16 07/22/15 04/20/14 04/18/13 01/22/13 12/08/12  PSA  Total PSA 2.13 ng/mL 2.29 ng/mL 1.76 ng/mL 1.39  1.88  1.71  1.88  2.35   Free PSA        0.76   % Free PSA        32     PROCEDURES:         Flexible Cystoscopy - 52000  Risks, benefits,  and some of the potential complications of the procedure were discussed. 3m of 2% lidocaine jelly was instilled intraurethrally.      Meatus:  Normal size. Normal location. Normal condition.  Urethra:  No strictures.  External Sphincter:  Normal.  Verumontanum:  Normal.  Prostate:  Enlarged median lobe. Moderate hyperplasia.  Bladder Neck:  Non-obstructing.  Ureteral Orifices:  Normal location. Normal size. Normal shape. Effluxed clear urine  on right. Effluxed bloody urine on left.   Bladder:  Mild trabeculation. No tumors. Normal mucosa. No stones.      The procedure was well tolerated and there were no complications.          KUB - [id number]  A single view of the abdomen is obtained. He has left lower pole stones with the largest 66m and at least 3 5-64mstones in the lower pole calyx. The lead stone appears larger than on his prior KUB. He has lumbar DDD but no bone, gas or soft tissue abnormalities.       . Patient confirmed No Neulasta OnPro Device.           Urinalysis w/Scope Dipstick Dipstick Cont'd Micro  Color: Amber Bilirubin: Neg mg/dL WBC/hpf: 0 - 5/hpf  Appearance: Cloudy Ketones: Neg mg/dL RBC/hpf: >60/hpf  Specific Gravity: 1.020 Blood: 3+ ery/uL Bacteria: Rare (0-9/hpf)  pH: 5.5 Protein: 2+ mg/dL Cystals: NS (Not Seen)  Glucose: Neg mg/dL Urobilinogen: 0.2 mg/dL Casts: NS (Not Seen)    Nitrites: Neg Trichomonas: Not Present    Leukocyte Esterase: Trace leu/uL Mucous: Not Present      Epithelial Cells: NS (Not Seen)      Yeast: NS (Not Seen)      Sperm: Not Present    Notes: unspun micro    ASSESSMENT:      ICD-10 Details  1 GU:   Gross hematuria - R31.0 Chronic, Stable - He has gross hematuria that has been persistent since January. It is coming form the right renal system and is probably from the stones and his use of Eliquis. I discussed ureteroscopy and ESWL and will get him set up for URS. I have reviewed the risks of ureteroscopy including bleeding,  infection, ureteral injury, need for a stent or secondary procedures, thrombotic events and anesthetic complications. I will get him cleared by cardiology.   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 There was no prostatic bleeding.   4   Urinary Frequency - R35.0 Chronic, Stable   PLAN:           Orders Labs Hemoglobin and Hematocrit  X-Rays: KUB          Schedule Return Visit/Planned Activity: Next Available Appointment - Schedule Surgery          Document Letter(s):  Created for Patient: Clinical Summary

## 2021-07-17 ENCOUNTER — Encounter (HOSPITAL_COMMUNITY)
Admission: RE | Disposition: A | Discharge status: Home / Self Care | Payer: Self-pay | Source: Home / Self Care | Attending: Urology

## 2021-07-17 ENCOUNTER — Ambulatory Visit (HOSPITAL_COMMUNITY)
Admission: RE | Admit: 2021-07-17 | Discharge: 2021-07-17 | Disposition: A | Discharge status: Home / Self Care | Payer: PPO | Attending: Urology | Admitting: Urology

## 2021-07-17 ENCOUNTER — Ambulatory Visit (HOSPITAL_COMMUNITY): Payer: PPO

## 2021-07-17 ENCOUNTER — Ambulatory Visit (HOSPITAL_BASED_OUTPATIENT_CLINIC_OR_DEPARTMENT_OTHER): Payer: PPO | Admitting: Anesthesiology

## 2021-07-17 ENCOUNTER — Ambulatory Visit (HOSPITAL_COMMUNITY): Payer: PPO | Admitting: Physician Assistant

## 2021-07-17 ENCOUNTER — Encounter (HOSPITAL_COMMUNITY): Payer: Self-pay | Admitting: Urology

## 2021-07-17 DIAGNOSIS — K219 Gastro-esophageal reflux disease without esophagitis: Secondary | ICD-10-CM | POA: Diagnosis not present

## 2021-07-17 DIAGNOSIS — E039 Hypothyroidism, unspecified: Secondary | ICD-10-CM

## 2021-07-17 DIAGNOSIS — I10 Essential (primary) hypertension: Secondary | ICD-10-CM | POA: Insufficient documentation

## 2021-07-17 DIAGNOSIS — I4891 Unspecified atrial fibrillation: Secondary | ICD-10-CM | POA: Insufficient documentation

## 2021-07-17 DIAGNOSIS — I2510 Atherosclerotic heart disease of native coronary artery without angina pectoris: Secondary | ICD-10-CM | POA: Insufficient documentation

## 2021-07-17 DIAGNOSIS — R310 Gross hematuria: Secondary | ICD-10-CM | POA: Insufficient documentation

## 2021-07-17 DIAGNOSIS — N200 Calculus of kidney: Secondary | ICD-10-CM

## 2021-07-17 DIAGNOSIS — Z7901 Long term (current) use of anticoagulants: Secondary | ICD-10-CM | POA: Insufficient documentation

## 2021-07-17 DIAGNOSIS — N401 Enlarged prostate with lower urinary tract symptoms: Secondary | ICD-10-CM | POA: Diagnosis not present

## 2021-07-17 DIAGNOSIS — G4730 Sleep apnea, unspecified: Secondary | ICD-10-CM | POA: Diagnosis not present

## 2021-07-17 HISTORY — PX: CYSTOSCOPY WITH RETROGRADE PYELOGRAM, URETEROSCOPY AND STENT PLACEMENT: SHX5789

## 2021-07-17 SURGERY — CYSTOURETEROSCOPY, WITH RETROGRADE PYELOGRAM AND STENT INSERTION
Anesthesia: General | Laterality: Left

## 2021-07-17 MED ORDER — EPHEDRINE SULFATE-NACL 50-0.9 MG/10ML-% IV SOSY
PREFILLED_SYRINGE | INTRAVENOUS | Status: DC | PRN
  Administered 2021-07-17 (×3): 5 mg via INTRAVENOUS

## 2021-07-17 MED ORDER — PROPOFOL 10 MG/ML IV BOLUS
INTRAVENOUS | Status: DC | PRN
  Administered 2021-07-17 (×2): 40 mg via INTRAVENOUS
  Administered 2021-07-17: 160 mg via INTRAVENOUS

## 2021-07-17 MED ORDER — CHLORHEXIDINE GLUCONATE 0.12 % MT SOLN
15.0000 mL | Freq: Once | OROMUCOSAL | Status: AC
  Administered 2021-07-17: 15 mL via OROMUCOSAL

## 2021-07-17 MED ORDER — 0.9 % SODIUM CHLORIDE (POUR BTL) OPTIME
TOPICAL | Status: DC | PRN
  Administered 2021-07-17: 1000 mL

## 2021-07-17 MED ORDER — IOHEXOL 300 MG/ML  SOLN
INTRAMUSCULAR | Status: DC | PRN
  Administered 2021-07-17: 25 mL

## 2021-07-17 MED ORDER — SODIUM CHLORIDE 0.9% FLUSH: 3.0000 mL | Freq: Two times a day (BID) | INTRAVENOUS | Status: DC

## 2021-07-17 MED ORDER — CEFAZOLIN SODIUM-DEXTROSE 2-4 GM/100ML-% IV SOLN
2.0000 g | INTRAVENOUS | Status: AC
  Administered 2021-07-17: 2 g via INTRAVENOUS
  Filled 2021-07-17: qty 100

## 2021-07-17 MED ORDER — FENTANYL CITRATE PF 50 MCG/ML IJ SOSY
25.0000 ug | PREFILLED_SYRINGE | INTRAMUSCULAR | Status: DC | PRN
  Administered 2021-07-17: 50 ug via INTRAVENOUS

## 2021-07-17 MED ORDER — OXYCODONE HCL 5 MG PO TABS: 5.0000 mg | ORAL_TABLET | ORAL | Status: DC | PRN

## 2021-07-17 MED ORDER — PHENYLEPHRINE 80 MCG/ML (10ML) SYRINGE FOR IV PUSH (FOR BLOOD PRESSURE SUPPORT)
PREFILLED_SYRINGE | INTRAVENOUS | Status: DC | PRN
  Administered 2021-07-17: 80 ug via INTRAVENOUS
  Administered 2021-07-17: 160 ug via INTRAVENOUS
  Administered 2021-07-17: 240 ug via INTRAVENOUS

## 2021-07-17 MED ORDER — ACETAMINOPHEN 325 MG PO TABS: 650.0000 mg | ORAL_TABLET | ORAL | Status: DC | PRN

## 2021-07-17 MED ORDER — ONDANSETRON HCL 4 MG/2ML IJ SOLN
INTRAMUSCULAR | Status: DC | PRN
  Administered 2021-07-17: 4 mg via INTRAVENOUS

## 2021-07-17 MED ORDER — DEXAMETHASONE SODIUM PHOSPHATE 10 MG/ML IJ SOLN
INTRAMUSCULAR | Status: DC | PRN
  Administered 2021-07-17: 10 mg via INTRAVENOUS

## 2021-07-17 MED ORDER — ACETAMINOPHEN 650 MG RE SUPP: 650.0000 mg | RECTAL | Status: DC | PRN

## 2021-07-17 MED ORDER — ACETAMINOPHEN 500 MG PO TABS
1000.0000 mg | ORAL_TABLET | Freq: Once | ORAL | Status: AC
  Administered 2021-07-17: 1000 mg via ORAL
  Filled 2021-07-17: qty 2

## 2021-07-17 MED ORDER — SODIUM CHLORIDE 0.9% FLUSH: 3.0000 mL | INTRAVENOUS | Status: DC | PRN

## 2021-07-17 MED ORDER — LIDOCAINE 2% (20 MG/ML) 5 ML SYRINGE
INTRAMUSCULAR | Status: DC | PRN
  Administered 2021-07-17: 60 mg via INTRAVENOUS

## 2021-07-17 MED ORDER — FENTANYL CITRATE (PF) 100 MCG/2ML IJ SOLN
INTRAMUSCULAR | Status: AC
  Filled 2021-07-17: qty 2

## 2021-07-17 MED ORDER — FENTANYL CITRATE (PF) 250 MCG/5ML IJ SOLN
INTRAMUSCULAR | Status: DC | PRN
Start: 2021-07-17 — End: 2021-07-17
  Administered 2021-07-17 (×2): 50 ug via INTRAVENOUS
  Administered 2021-07-17 (×2): 25 ug via INTRAVENOUS

## 2021-07-17 MED ORDER — PROPOFOL 10 MG/ML IV BOLUS
INTRAVENOUS | Status: AC
  Filled 2021-07-17: qty 20

## 2021-07-17 MED ORDER — MORPHINE SULFATE (PF) 2 MG/ML IV SOLN: 2.0000 mg | INTRAVENOUS | Status: DC | PRN

## 2021-07-17 MED ORDER — FENTANYL CITRATE PF 50 MCG/ML IJ SOSY
PREFILLED_SYRINGE | INTRAMUSCULAR | Status: AC
  Filled 2021-07-17: qty 1

## 2021-07-17 MED ORDER — ORAL CARE MOUTH RINSE: 15.0000 mL | Freq: Once | OROMUCOSAL | Status: AC

## 2021-07-17 MED ORDER — LACTATED RINGERS IV SOLN: INTRAVENOUS | Status: DC

## 2021-07-17 MED ORDER — SODIUM CHLORIDE 0.9 % IV SOLN: 250.0000 mL | INTRAVENOUS | Status: DC | PRN

## 2021-07-17 MED ORDER — SODIUM CHLORIDE 0.9 % IR SOLN
Status: DC | PRN
  Administered 2021-07-17: 6000 mL

## 2021-07-17 MED ORDER — TRAMADOL HCL 50 MG PO TABS: 50.0000 mg | ORAL_TABLET | Freq: Four times a day (QID) | ORAL | 0 refills | Status: AC | PRN

## 2021-07-17 SURGICAL SUPPLY — 27 items
BAG DRN RND TRDRP ANRFLXCHMBR (UROLOGICAL SUPPLIES) ×1
BAG URINE DRAIN 2000ML AR STRL (UROLOGICAL SUPPLIES) ×1 IMPLANT
BAG URO CATCHER STRL LF (MISCELLANEOUS) ×2 IMPLANT
BASKET STONE NCOMPASS (UROLOGICAL SUPPLIES) IMPLANT
CATH FOLEY 2WAY SLVR  5CC 20FR (CATHETERS) ×2
CATH FOLEY 2WAY SLVR 5CC 20FR (CATHETERS) IMPLANT
CATH URETERAL DUAL LUMEN 10F (MISCELLANEOUS) IMPLANT
CATH URETL OPEN 5X70 (CATHETERS) ×1 IMPLANT
CLOTH BEACON ORANGE TIMEOUT ST (SAFETY) ×2 IMPLANT
EXTRACTOR STONE NITINOL NGAGE (UROLOGICAL SUPPLIES) ×1 IMPLANT
GLOVE SURG SS PI 8.0 STRL IVOR (GLOVE) ×2 IMPLANT
GOWN STRL REUS W/ TWL XL LVL3 (GOWN DISPOSABLE) ×1 IMPLANT
GOWN STRL REUS W/TWL XL LVL3 (GOWN DISPOSABLE) ×2
GUIDEWIRE STR DUAL SENSOR (WIRE) ×2 IMPLANT
IV NS IRRIG 3000ML ARTHROMATIC (IV SOLUTION) ×1 IMPLANT
KIT TURNOVER KIT A (KITS) IMPLANT
LASER FIB FLEXIVA PULSE ID 365 (Laser) IMPLANT
LASER FIB FLEXIVA PULSE ID 550 (Laser) IMPLANT
LASER FIB FLEXIVA PULSE ID 910 (Laser) IMPLANT
MANIFOLD NEPTUNE II (INSTRUMENTS) ×2 IMPLANT
PACK CYSTO (CUSTOM PROCEDURE TRAY) ×2 IMPLANT
SHEATH NAVIGATOR HD 11/13X36 (SHEATH) ×1 IMPLANT
STENT URET 6FRX26 CONTOUR (STENTS) ×1 IMPLANT
TRACTIP FLEXIVA PULS ID 200XHI (Laser) IMPLANT
TRACTIP FLEXIVA PULSE ID 200 (Laser) ×2
TUBING CONNECTING 10 (TUBING) ×2 IMPLANT
TUBING UROLOGY SET (TUBING) ×2 IMPLANT

## 2021-07-17 NOTE — Interval H&P Note (Signed)
History and Physical Interval Note:  07/17/2021 9:57 AM  Miguel Morgan  has presented today for surgery, with the diagnosis of GROSS HEMATURIA LEFT RENAL STONE.  The various methods of treatment have been discussed with the patient and family. After consideration of risks, benefits and other options for treatment, the patient has consented to  Procedure(s): CYSTOSCOPY WITH RETROGRADE  URETEROSCOPY WITH HOLMIUM LASER AND STENT PLACEMENT (Left) as a surgical intervention.  The patient's history has been reviewed, patient examined, no change in status, stable for surgery.  I have reviewed the patient's chart and labs.  Questions were answered to the patient's satisfaction.     Irine Seal

## 2021-07-17 NOTE — Op Note (Signed)
Procedure: 1.  Cystoscopy with left retrograde pyelogram and interpretation. 2.  Left ureteroscopy with holmium laser application, stone extraction and insertion of left double-J stent. 3.  Application of fluoroscopy.  Preop diagnosis: Gross hematuria from left renal stones.  Postop diagnosis: Same.  Surgeon: Dr. Irine Seal.  Anesthesia: General.  Specimen: Stone fragments.  Drains: 6 French by 26 cm left contour double-J stent without tether.  EBL: 10 mL.  Complications: None.  Indications: The patient is a 81 year old male who was found on evaluation for gross hematuria to have bloody E flux from the left ureteral orifice on cystoscopy and left renal stones that were felt to be the cause.  After reviewing the options he elected ureteroscopy for management.  Procedure: He was taken operating room where he was given Ancef.  A general anesthetic was induced.  He was placed in lithotomy position and fitted with PAS hose.  His perineum and genitalia were prepped with Betadine solution he was draped in usual sterile fashion.  Cystoscopy was performed using a 21 [place] scope and 30 degree lens.  Examination revealed a normal urethra.  The external sphincter was intact.  The prostatic urethra was approximately 4 to 5 cm in length with bilobar hyperplasia with coaptation and obstruction.  On passage of the scope there was some bleeding noted at the bladder neck from scope passage.  Examination of bladder revealed mild trabeculation without tumors or inflammation but there were small bladder stones that were evacuated from the bladder.  Ureteral orifices were in the normal anatomic position.  A 5 French open-ended catheter was then used to instill Omnipaque to perform a left retrograde pyelogram.  The left retrograde pyelogram revealed a normal caliber ureter and intrarenal collecting system without obstruction.  There was a filling defect in the infundibulum of the lower pole with his larger 12  mm stone and there were multiple stones in the lower pole below that.  A sensor wire was then advanced to the kidney and the cystoscope was removed.  The 30 French inner core of a 36 cm digital access sheath was then easily passed to the proximal ureter.  This was followed by the assembled sheath.  The inner core and wire were then removed.  A dual-lumen digital flexible scope was then passed to the kidney and the collecting system was inspected.  He was found to have a large Randall's plaque in a midpole calyx and then the 12 mm stone in the lower pole infundibulum with stones proximal to that in the calyx.  A 200 m tract tip laser fiber was passed through the scope and the holmium laser was set on the dusting setting with point 3 J and 60 Hz on the left pedal and 1 J and 10 Hz on the right couple.  The large stone in the infundibulum of the lower pole was initially difficult to treat because I had a hard time getting an angle on the stone but I was able to bypass the stone and get into the calyx where I fragmented the multiple stones in the calyx.  I then was able to back up and eventually fragment the stone into the infundibulum and push the fragments back into the calyx where they could be more readily intact with the laser.  Once the initial fragmentation was performed, an engage basket was used to retrieve multiple fragments.  The stone fragmented easily but did not dust easily and the fragments were primarily in the 2 to 4 mm  range.  Additional lasering was performed as needed but I removed all significant fragments that were visible in the lower calyx and eventually was able to remove the large Randall's plaque from the mid calyx.  There was some dust that appeared to be present in the upper calyx but not of sufficient size to require removal.  After removal of all retrievable fragments, a sensor wire was advanced through the ureteroscope to the kidney and the scope was removed while visually  inspecting the ureter.  No significant ureteral trauma was identified.  Once the scope was out the cystoscope was reinserted over the wire and a 6 [place] by 26 cm contour double-J stent without tether was then placed to the kidney under fluoroscopic guidance.  The wire was removed, leaving good coil in the kidney and a good coil in the bladder.  He was noted to have some continued bleeding from veins in the prostatic urethra and after I removed the scope placed a 20 French Foley catheter and filled 1 with 20 mL of sterile fluid with a catheter on traction and the urine cleared quickly.  I will leave the catheter in on traction and reassess in PACU to determine if we can remove it prior to discharge.  He was taken down from lithotomy position, his anesthetic was reversed and he was moved to recovery in stable condition.  There were no complications.  The stone fragments were given to the family to bring to the office for analysis.

## 2021-07-17 NOTE — Anesthesia Procedure Notes (Signed)
Procedure Name: LMA Insertion Date/Time: 07/17/2021 10:19 AM  Performed by: Vonna Drafts, CRNAPre-anesthesia Checklist: Patient identified, Emergency Drugs available, Suction available, Patient being monitored and Timeout performed Patient Re-evaluated:Patient Re-evaluated prior to induction Oxygen Delivery Method: Circle system utilized Preoxygenation: Pre-oxygenation with 100% oxygen Induction Type: IV induction Ventilation: Mask ventilation without difficulty LMA: LMA inserted LMA Size: 5.0 Number of attempts: 1 Tube secured with: Tape Dental Injury: Teeth and Oropharynx as per pre-operative assessment

## 2021-07-17 NOTE — Discharge Instructions (Addendum)
You may resume the Eliquis in 2 days if there is no bleeding.   Please bring your stone fragments to the office at follow up.  Remove foley catheter Monday morning 07/20/2021. IF you have any questions or concerns or don't feel comfortable. Please call the office.

## 2021-07-17 NOTE — Transfer of Care (Signed)
Immediate Anesthesia Transfer of Care Note  Patient: Miguel Morgan  Procedure(s) Performed: CYSTOSCOPY WITH RETROGRADE  URETEROSCOPY WITH HOLMIUM LASER AND STENT PLACEMENT (Left)  Patient Location: PACU  Anesthesia Type:General  Level of Consciousness: drowsy  Airway & Oxygen Therapy: Patient Spontanous Breathing and Patient connected to face mask oxygen  Post-op Assessment: Report given to RN and Post -op Vital signs reviewed and stable  Post vital signs: Reviewed and stable  Last Vitals:  Vitals Value Taken Time  BP 135/71 07/17/21 1219  Temp    Pulse 56 07/17/21 1223  Resp 14 07/17/21 1223  SpO2 97 % 07/17/21 1223  Vitals shown include unvalidated device data.  Last Pain:  Vitals:   07/17/21 0822  TempSrc: Oral         Complications: No notable events documented.

## 2021-07-17 NOTE — OR Nursing (Signed)
Stone taken by Dr. Wrenn 

## 2021-07-19 ENCOUNTER — Encounter (HOSPITAL_COMMUNITY): Payer: Self-pay | Admitting: Urology

## 2021-07-20 NOTE — Anesthesia Postprocedure Evaluation (Signed)
Anesthesia Post Note  Patient: Miguel Morgan  Procedure(s) Performed: CYSTOSCOPY WITH RETROGRADE  URETEROSCOPY WITH HOLMIUM LASER AND STENT PLACEMENT (Left)     Patient location during evaluation: PACU Anesthesia Type: General Level of consciousness: awake and alert Pain management: pain level controlled Vital Signs Assessment: post-procedure vital signs reviewed and stable Respiratory status: spontaneous breathing, nonlabored ventilation, respiratory function stable and patient connected to nasal cannula oxygen Cardiovascular status: blood pressure returned to baseline and stable Postop Assessment: no apparent nausea or vomiting Anesthetic complications: no   No notable events documented.  Last Vitals:  Vitals:   07/17/21 1415 07/17/21 1430  BP: (!) 159/65 (!) 137/100  Pulse: 63 64  Resp: 18   Temp: 37.4 C   SpO2: 95% 94%    Last Pain:  Vitals:   07/17/21 1422  TempSrc:   PainSc: 0-No pain                 Jessyka Austria L Peniel Hass

## 2021-07-28 ENCOUNTER — Telehealth: Payer: Self-pay | Admitting: Cardiology

## 2021-07-28 NOTE — Telephone Encounter (Signed)
Miguel Morgan per DPR that we need a clearance form form the provider in order to get the best answer. Hoyle Sauer repots she will call and have the office to send the letter.

## 2021-07-28 NOTE — Telephone Encounter (Signed)
Patient's wife states the patient is scheduled to have a spinal injection on 08/03/21 and she would like to know whether or not patient will need to hold Eliquis and if so, then how many days prior? She states the performing office is not requesting clearance. She would just like to know to be proactive.

## 2021-09-14 ENCOUNTER — Other Ambulatory Visit: Payer: Self-pay

## 2021-09-14 MED ORDER — METOPROLOL TARTRATE 25 MG PO TABS: 25.0000 mg | ORAL_TABLET | Freq: Two times a day (BID) | ORAL | 3 refills | Status: DC

## 2021-11-18 DIAGNOSIS — G5600 Carpal tunnel syndrome, unspecified upper limb: Secondary | ICD-10-CM | POA: Insufficient documentation

## 2021-11-18 DIAGNOSIS — M19031 Primary osteoarthritis, right wrist: Secondary | ICD-10-CM | POA: Insufficient documentation

## 2021-11-18 DIAGNOSIS — M19032 Primary osteoarthritis, left wrist: Secondary | ICD-10-CM | POA: Insufficient documentation

## 2021-11-18 DIAGNOSIS — S56119A Strain of flexor muscle, fascia and tendon of finger of unspecified finger at forearm level, initial encounter: Secondary | ICD-10-CM | POA: Insufficient documentation

## 2021-11-18 HISTORY — DX: Carpal tunnel syndrome, unspecified upper limb: G56.00

## 2021-11-18 HISTORY — DX: Strain of flexor muscle, fascia and tendon of finger of unspecified finger at forearm level, initial encounter: S56.119A

## 2021-11-18 HISTORY — DX: Primary osteoarthritis, left wrist: M19.032

## 2021-11-18 HISTORY — DX: Primary osteoarthritis, right wrist: M19.031

## 2021-11-20 ENCOUNTER — Telehealth: Payer: Self-pay

## 2021-11-20 DIAGNOSIS — I4891 Unspecified atrial fibrillation: Secondary | ICD-10-CM | POA: Insufficient documentation

## 2021-11-20 DIAGNOSIS — Z87442 Personal history of urinary calculi: Secondary | ICD-10-CM | POA: Insufficient documentation

## 2021-11-20 NOTE — Telephone Encounter (Signed)
   Pre-operative Risk Assessment    Patient Name: Miguel Morgan  DOB: 03-06-1940 MRN: [id number]      Request for Surgical Clearance    Procedure:   Left Carpal Tunnel Release  Date of Surgery:  Clearance 12/30/21                                 Surgeon:  Dr Roseanne Kaufman Surgeon's Group or Practice Name:  [facility] Phone number:  [phone] Fax number:  [phone]   Type of Clearance Requested:   - Pharmacy:  Hold Apixaban (Eliquis) Eliquis  and Medical    Type of Anesthesia:  Local    Additional requests/questions:  Please fax a copy of completed faxed form  to the surgeon's office. Request in chart media  Signed, Toni Arthurs   11/20/2021, 9:44 AM

## 2021-11-23 ENCOUNTER — Encounter: Payer: Self-pay | Admitting: Cardiology

## 2021-11-23 ENCOUNTER — Ambulatory Visit: Payer: PPO | Attending: Cardiology | Admitting: Cardiology

## 2021-11-23 VITALS — BP 140/80 | HR 60 | Ht 71.0 in | Wt 228.0 lb

## 2021-11-23 DIAGNOSIS — I10 Essential (primary) hypertension: Secondary | ICD-10-CM | POA: Diagnosis not present

## 2021-11-23 DIAGNOSIS — E782 Mixed hyperlipidemia: Secondary | ICD-10-CM

## 2021-11-23 DIAGNOSIS — I480 Paroxysmal atrial fibrillation: Secondary | ICD-10-CM

## 2021-11-23 DIAGNOSIS — I25118 Atherosclerotic heart disease of native coronary artery with other forms of angina pectoris: Secondary | ICD-10-CM

## 2021-11-23 DIAGNOSIS — Z7901 Long term (current) use of anticoagulants: Secondary | ICD-10-CM | POA: Diagnosis not present

## 2021-11-23 NOTE — Telephone Encounter (Signed)
   Name: Miguel Morgan  DOB: 27-Jun-1940  MRN: [id number]  Primary Cardiologist: [name], MD   Preoperative team, please contact this patient and set up a phone call appointment for further preoperative risk assessment. Please obtain consent and complete medication review. Thank you for your help.  I confirm that guidance regarding antiplatelet and oral anticoagulation therapy has been completed and, if necessary, noted below.  Clinical pharmacist has addressed anticoagulation request.   Deberah Pelton, NP 11/23/2021, 10:29 AM [place]

## 2021-11-23 NOTE — Patient Instructions (Signed)
Medication Instructions:  Your physician recommends that you continue on your current medications as directed. Please refer to the Current Medication list given to you today.  *If you need a refill on your cardiac medications before your next appointment, please call your pharmacy*   Lab Work: Your physician recommends that you return for lab work in:   Labs today: CMP, Lipid  If you have labs (blood work) drawn today and your tests are completely normal, you will receive your results only by: [place] (if you have [place]) OR A paper copy in the mail If you have any lab test that is abnormal or we need to change your treatment, we will call you to review the results.   Testing/Procedures: None   Follow-Up: At [facility], you and your health needs are our priority.  As part of our continuing mission to provide you with exceptional heart care, we have created designated Provider Care Teams.  These Care Teams include your primary Cardiologist (physician) and Advanced Practice Providers (APPs -  Physician Assistants and Nurse Practitioners) who all work together to provide you with the care you need, when you need it.  We recommend signing up for the patient portal called "MyChart".  Sign up information is provided on this After Visit Summary.  MyChart is used to connect with patients for Virtual Visits (Telemedicine).  Patients are able to view lab/test results, encounter notes, upcoming appointments, etc.  Non-urgent messages can be sent to your provider as well.   To learn more about what you can do with MyChart, go to [email or web address].    Your next appointment:   9 month(s)  The format for your next appointment:   In Person  Provider:   [name], MD    Other Instructions None  Important Information About Sugar

## 2021-11-23 NOTE — Telephone Encounter (Signed)
Patient with diagnosis of afib on Eliquis for anticoagulation.    Procedure: left carpal tunnel release Date of procedure: 12/30/21  CHA2DS2-VASc Score = 4  This indicates a 4.8% annual risk of stroke. The patient's score is based upon: CHF History: 0 HTN History: 1 Diabetes History: 0 Stroke History: 0 Vascular Disease History: 1 Age Score: 2 Gender Score: 0   CrCl 67m/min using adjusted body weight Platelet count 201K  Per office protocol, patient can hold Eliquis for 1-2 days prior to procedure.    **This guidance is not considered finalized until pre-operative APP has relayed final recommendations.**

## 2021-11-23 NOTE — Progress Notes (Signed)
Cardiology Office Note:    Date:  11/23/2021   ID:  Miguel Morgan, DOB [birth year], MRN [id number]  PCP:  [name], MD  Cardiologist:  [name], MD    Referring MD: [name], MD    ASSESSMENT:    1. Paroxysmal atrial fibrillation (HCC)   2. Chronic anticoagulation   3. Coronary artery disease involving native coronary artery of native heart with other form of angina pectoris ([place])   4. Primary hypertension   5. Mixed hyperlipidemia    PLAN:    In order of problems listed above:  Continues to do well maintaining sinus rhythm and now able to tolerate his anticoagulant after removal of renal stones no further hematuria.  He is going to defer a Watchman device Stable CAD no anginal discomfort continue treatment including diltiazem metoprolol and his high intensity statin.  He has had no anginal discomfort Well-controlled continue his current treatment Continue his lipid-lowering treatment   Next appointment: 9 months   Medication Adjustments/Labs and Tests Ordered: Current medicines are reviewed at length with the patient today.  Concerns regarding medicines are outlined above.  No orders of the defined types were placed in this encounter.  No orders of the defined types were placed in this encounter.   Chief Complaint  Patient presents with   Follow-up   Atrial Fibrillation   Anticoagulation    History of Present Illness:    Miguel Morgan is a 81 y.o. male with a hx of paroxysmal atrial fibrillation on chronic anticoagulation hypertensive heart disease hyperlipidemia and CAD last seen 05/21/2021 and referred for consideration of Watchman device.  Compliance with diet, lifestyle and medications: Yes  Continues to do well he passed 32 kidney stones and since then feels much better no renal pain and no recurrent hematuria and tolerates his anticoagulant He does not want to pursue Watchman device He has an Apple watch and has had no alerts for  rapid heart rate or atrial fibrillation No edema shortness of breath orthopnea chest pain palpitation or syncope Past Medical History:  Diagnosis Date   A-fib ([place])    Arthritis    Colon polyps    Coronary artery disease    nonobstructive CAD   Diverticulitis    GERD (gastroesophageal reflux disease)    History of kidney stones    Hyperlipidemia    Hypertension    Hypothyroid 06/29/2011   IBS (irritable bowel syndrome)    Lumbar spinal stenosis    Mild depression    Pernicious anemia    Primary osteoarthritis of left knee 06/04/2015   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 Normal EF.    CARPAL TUNNEL RELEASE Right 1984   cataract surgery  2009   with lens implant   COLONOSCOPY     CYSTOSCOPY WITH RETROGRADE PYELOGRAM, URETEROSCOPY AND STENT PLACEMENT Left 07/17/2021   Procedure: CYSTOSCOPY WITH RETROGRADE  URETEROSCOPY WITH HOLMIUM LASER AND STENT PLACEMENT;  Surgeon: Irine Seal, MD;  Location: [facility];  Service: Urology;  Laterality: Left;   [place]   KNEE SURGERY  2005   right - partial knee replacement   LITHOTRIPSY     LUMBAR [place] SURGERY  04/15/2010  L2-3 central decompression, L4-5 left decompression. Dr. Lang Snow   TOTAL KNEE ARTHROPLASTY Left 06/09/2015   Procedure: TOTAL KNEE ARTHROPLASTY;  Surgeon: Vickey Huger, MD;  Location: [place];  Service: Orthopedics;  Laterality: Left;   UMBILICAL HERNIA REPAIR  2018   VASECTOMY  1980    Current Medications: Current Meds  Medication Sig   apixaban (ELIQUIS) 5 MG TABS tablet Take 1 tablet (5 mg total) by mouth 2 (two) times daily.   Ascorbic Acid (VITAMIN C) 1000 MG tablet Take 1,000 mg by mouth daily.   Cholecalciferol (VITAMIN D) 125 MCG  (5000 UT) CAPS Take 5,000 Units by mouth daily.   Cyanocobalamin (VITAMIN B-12) 5000 MCG TBDP Take 5,000 mcg by mouth daily.   diltiazem (TIAZAC) 360 MG 24 hr capsule Take 360 mg by mouth daily.   escitalopram (LEXAPRO) 10 MG tablet Take 10 mg by mouth daily.   finasteride (PROSCAR) 5 MG tablet Take 5 mg by mouth daily.   levothyroxine (SYNTHROID) 150 MCG tablet Take 1 tablet (150 mcg total) by mouth daily.   losartan (COZAAR) 100 MG tablet Take 1 tablet (100 mg total) by mouth daily.   metoprolol tartrate (LOPRESSOR) 25 MG tablet Take 1 tablet (25 mg total) by mouth 2 (two) times daily.   nitroGLYCERIN (NITROSTAT) 0.4 MG SL tablet Place 0.4 mg under the tongue every 5 (five) minutes as needed for chest pain.   omeprazole (PRILOSEC) 40 MG capsule Take 40 mg by mouth daily.   Probiotic Product (PROBIOTIC DAILY PO) Take 1 capsule by mouth daily.   Propylene Glycol (SYSTANE BALANCE) 0.6 % SOLN Place 1 drop into both eyes as needed (eye irritations).   rosuvastatin (CRESTOR) 5 MG tablet Take 1 tablet (5 mg total) by mouth daily.   traMADol (ULTRAM) 50 MG tablet Take 1 tablet (50 mg total) by mouth every 6 (six) hours as needed.     Allergies:   Hydrocodone   Social History   Socioeconomic History   Marital status: Married    Spouse name: Not on file   Number of children: Not on file   Years of education: Not on file   Highest education level: Not on file  Occupational History   Not on file  Tobacco Use   Smoking status: Never    Passive exposure: Never   Smokeless tobacco: Never  Vaping Use   Vaping Use: Never used  Substance and Sexual Activity   Alcohol use: No   Drug use: No   Sexual activity: Not on file  Other Topics Concern   Not on file  Social History Narrative   Not on file   Social Determinants of Health   Financial Resource Strain: Not on file  Food Insecurity: Not on file  Transportation Needs: Not on file  Physical Activity: Not on file  Stress: Not on file   Social Connections: Not on file     Family History: The patient's family history includes Diabetes in his sister; Heart attack in his mother; Hypertension in his mother and sister; Stomach cancer in his father. ROS:   Please see the history of present illness.    All other systems reviewed and are negative.  EKGs/Labs/Other Studies Reviewed:    The following studies were reviewed today:   Echocardiogram performed 04/15/2021. Left ventricle normal in size wall thickness ejection fraction 60 to 69% however diastolic dysfunction seen with elevated filling pressure and left atrial pressure.  Right ventricle is normal size and function.  There is no  significant valvular abnormality    1. GLS -15.6. Left ventricular ejection fraction, by estimation, is 60 to  65%. The left ventricle has normal function. The left ventricle has no  regional wall motion abnormalities. Left ventricular diastolic parameters  are consistent with Grade II  diastolic dysfunction (pseudonormalization).   2. Right ventricular systolic function is normal. The right ventricular  size is normal.   3. The mitral valve is normal in structure. No evidence of mitral valve  regurgitation. No evidence of mitral stenosis.   4. The aortic valve is normal in structure. Aortic valve regurgitation is  not visualized. Aortic valve sclerosis/calcification is present, without  any evidence of aortic stenosis.   5. The inferior vena cava is normal in size with greater than 50%  respiratory variability, suggesting right atrial pressure of 3 mmHg.  Recent Labs: 07/08/2021: BUN 17; Creatinine, Ser 1.01; Hemoglobin 14.1; Platelets 201; Potassium 4.4; Sodium 143  Recent Lipid Panel No results found for: "CHOL", "TRIG", "HDL", "CHOLHDL", "VLDL", "LDLCALC", "LDLDIRECT"  Physical Exam:    VS:  BP (!) 140/80 (BP Location: Right Arm, Patient Position: Sitting, Cuff Size: Normal)   Pulse 60   Ht '5\' 11"'$  (1.803 m)   Wt 228 lb (103.4 kg)    SpO2 95%   BMI 31.80 kg/m     Wt Readings from Last 3 Encounters:  11/23/21 228 lb (103.4 kg)  07/08/21 219 lb (99.3 kg)  05/21/21 217 lb (98.4 kg)     GEN:  Well nourished, well developed in no acute distress HEENT: Normal NECK: No JVD; No carotid bruits LYMPHATICS: No lymphadenopathy CARDIAC: RRR, no murmurs, rubs, gallops RESPIRATORY:  Clear to auscultation without rales, wheezing or rhonchi  ABDOMEN: Soft, non-tender, non-distended MUSCULOSKELETAL:  No edema; No deformity  SKIN: Warm and dry NEUROLOGIC:  Alert and oriented x 3 PSYCHIATRIC:  Normal affect    Signed, [name], MD  11/23/2021 1:45 PM    [facility]

## 2021-11-24 LAB — COMPREHENSIVE METABOLIC PANEL
ALT: 15 IU/L (ref 0–44)
AST: 16 IU/L (ref 0–40)
Albumin/Globulin Ratio: 1.6 (ref 1.2–2.2)
Albumin: 4.2 g/dL (ref 3.8–4.8)
Alkaline Phosphatase: 92 IU/L (ref 44–121)
BUN/Creatinine Ratio: 14 (ref 10–24)
BUN: 16 mg/dL (ref 8–27)
Bilirubin Total: 0.3 mg/dL (ref 0.0–1.2)
CO2: 24 mmol/L (ref 20–29)
Calcium: 8.9 mg/dL (ref 8.6–10.2)
Chloride: 105 mmol/L (ref 96–106)
Creatinine, Ser: 1.12 mg/dL (ref 0.76–1.27)
Globulin, Total: 2.6 g/dL (ref 1.5–4.5)
Glucose: 109 mg/dL — ABNORMAL HIGH (ref 70–99)
Potassium: 4.1 mmol/L (ref 3.5–5.2)
Sodium: 143 mmol/L (ref 134–144)
Total Protein: 6.8 g/dL (ref 6.0–8.5)
eGFR: 67 mL/min/{1.73_m2} (ref >59)

## 2021-11-24 LAB — LIPID PANEL
Chol/HDL Ratio: 3.4 ratio (ref 0.0–5.0)
Cholesterol, Total: 163 mg/dL (ref 100–199)
HDL: 48 mg/dL (ref >39)
LDL Chol Calc (NIH): 72 mg/dL (ref 0–99)
Triglycerides: 265 mg/dL — ABNORMAL HIGH (ref 0–149)
VLDL Cholesterol Cal: 43 mg/dL — ABNORMAL HIGH (ref 5–40)

## 2021-11-24 NOTE — Telephone Encounter (Signed)
S/w the pt and his wife and they are at the grocery store right now. I asked would they like for me to call back later today so that we may schedule a tele pre op appt. Pt's wife said yes that will be fine.

## 2021-11-24 NOTE — Telephone Encounter (Signed)
Left message to call back to schedule tele pre op appt.  

## 2021-11-30 ENCOUNTER — Telehealth: Payer: Self-pay | Admitting: *Deleted

## 2021-11-30 NOTE — Telephone Encounter (Signed)
I s/w pt's wife (DPR) and she has scheduled tele pre op appt 12/25/21 @ 10 am for the pt. Med rec and consent are done.      Patient Consent for Virtual Visit        Miguel Morgan has provided verbal consent on 11/30/2021 for a virtual visit (video or telephone).   CONSENT FOR VIRTUAL VISIT FOR:  Miguel Morgan  By participating in this virtual visit I agree to the following:  I hereby voluntarily request, consent and authorize [facility] and its employed or contracted physicians, physician assistants, nurse practitioners or other licensed health care professionals (the Practitioner), to provide me with telemedicine health care services (the "Services") as deemed necessary by the treating Practitioner. I acknowledge and consent to receive the Services by the Practitioner via telemedicine. I understand that the telemedicine visit will involve communicating with the Practitioner through live audiovisual communication technology and the disclosure of certain medical information by electronic transmission. I acknowledge that I have been given the opportunity to request an in-person assessment or other available alternative prior to the telemedicine visit and am voluntarily participating in the telemedicine visit.  I understand that I have the right to withhold or withdraw my consent to the use of telemedicine in the course of my care at any time, without affecting my right to future care or treatment, and that the Practitioner or I may terminate the telemedicine visit at any time. I understand that I have the right to inspect all information obtained and/or recorded in the course of the telemedicine visit and may receive copies of available information for a reasonable fee.  I understand that some of the potential risks of receiving the Services via telemedicine include:  Delay or interruption in medical evaluation due to technological equipment failure or disruption; Information  transmitted may not be sufficient (e.g. poor resolution of images) to allow for appropriate medical decision making by the Practitioner; and/or  In rare instances, security protocols could fail, causing a breach of personal health information.  Furthermore, I acknowledge that it is my responsibility to provide information about my medical history, conditions and care that is complete and accurate to the best of my ability. I acknowledge that Practitioner's advice, recommendations, and/or decision may be based on factors not within their control, such as incomplete or inaccurate data provided by me or distortions of diagnostic images or specimens that may result from electronic transmissions. I understand that the practice of medicine is not an exact science and that Practitioner makes no warranties or guarantees regarding treatment outcomes. I acknowledge that a copy of this consent can be made available to me via my patient portal ([place]), or I can request a printed copy by calling the office of [facility].    I understand that my insurance will be billed for this visit.   I have read or had this consent read to me. I understand the contents of this consent, which adequately explains the benefits and risks of the Services being provided via telemedicine.  I have been provided ample opportunity to ask questions regarding this consent and the Services and have had my questions answered to my satisfaction. I give my informed consent for the services to be provided through the use of telemedicine in my medical care

## 2021-11-30 NOTE — Telephone Encounter (Signed)
I s/w pt's wife (DPR) and she has scheduled tele pre op appt 12/25/21 @ 10 am for the pt. Med rec and consent are done.

## 2021-12-03 ENCOUNTER — Telehealth: Payer: Self-pay

## 2021-12-03 MED ORDER — APIXABAN 5 MG PO TABS: 5.0000 mg | ORAL_TABLET | Freq: Two times a day (BID) | ORAL | 3 refills | Status: DC

## 2021-12-03 NOTE — Telephone Encounter (Signed)
[place] patient assistance form completed for Eliquis. Form and Rx to provider for signature

## 2021-12-08 NOTE — Telephone Encounter (Signed)
[place] patient assistance forms faxed for [employer]

## 2021-12-25 ENCOUNTER — Ambulatory Visit: Payer: PPO | Attending: Cardiology | Admitting: General Practice

## 2021-12-25 DIAGNOSIS — Z01810 Encounter for preprocedural cardiovascular examination: Secondary | ICD-10-CM | POA: Diagnosis not present

## 2021-12-25 NOTE — Progress Notes (Signed)
Virtual Visit via Telephone Note   Because of Miguel Morgan's co-morbid illnesses, he is at least at moderate risk for complications without adequate follow up.  This format is felt to be most appropriate for this patient at this time.  The patient did not have access to video technology/had technical difficulties with video requiring transitioning to audio format only (telephone).  All issues noted in this document were discussed and addressed.  No physical exam could be performed with this format.  Please refer to the patient's chart for his consent to telehealth for [facility].  Evaluation Performed:  Preoperative cardiovascular risk assessment _____________   Date:  12/25/2021   Patient ID:  Miguel Morgan, DOB 1940-12-06, MRN [id number] Patient Location:  Home Provider location:   Office  Primary Care Provider:  [name], MD Primary Cardiologist:  [name], MD  Chief Complaint / Patient Profile   81 y.o. y/o male with a h/o paroxysmal atrial fibrillation, HLD, HTN, CAD who is pending carpal tunnel release and presents today for telephonic preoperative cardiovascular risk assessment.  History of Present Illness    Miguel Morgan is a 81 y.o. male who presents via audio/video conferencing for a telehealth visit today.  Pt was last seen in cardiology clinic on 11/23/2021 by Dr. Bettina Gavia.  At that time Miguel Morgan was doing well .  The patient is now pending procedure as outlined above. Since his last visit, he remains stable from a cardiac standpoint.  Today he denies chest pain, shortness of breath, lower extremity edema, fatigue, palpitations, melena, hematuria, hemoptysis, diaphoresis, weakness, presyncope, syncope, orthopnea, and PND.   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 Normal EF.    CARPAL TUNNEL RELEASE Right 1984   cataract surgery  2009   with lens implant   COLONOSCOPY     CYSTOSCOPY WITH RETROGRADE PYELOGRAM, URETEROSCOPY AND STENT PLACEMENT Left 07/17/2021   Procedure: CYSTOSCOPY WITH RETROGRADE  URETEROSCOPY WITH HOLMIUM LASER AND STENT PLACEMENT;  Surgeon: Irine Seal, MD;  Location: [facility];  Service: Urology;  Laterality: Left;   [place]   KNEE SURGERY  2005   right - partial knee replacement   LITHOTRIPSY     LUMBAR [place] SURGERY  04/15/2010   L2-3 central decompression, L4-5 left decompression. Dr. Lang Snow   TOTAL KNEE ARTHROPLASTY Left 06/09/2015   Procedure: TOTAL KNEE ARTHROPLASTY;  Surgeon: Vickey Huger, MD;  Location: [place];  Service: Orthopedics;  Laterality: Left;   UMBILICAL HERNIA REPAIR  2018   VASECTOMY  1980    Allergies  Allergies  Allergen Reactions   Hydrocodone Other (  See Comments)    Flushing and sweating    Home Medications    Prior to Admission medications   Medication Sig Start Date End Date Taking? Authorizing Provider  apixaban (ELIQUIS) 5 MG TABS tablet Take 1 tablet (5 mg total) by mouth 2 (two) times daily. 12/03/21   Richardo Priest, MD  Ascorbic Acid (VITAMIN C) 1000 MG tablet Take 1,000 mg by mouth daily.    [provider]  Cholecalciferol (VITAMIN D) 125 MCG (5000 UT) CAPS Take 5,000 Units by mouth daily.    [provider]  Cyanocobalamin (VITAMIN B-12) 5000 MCG TBDP Take 5,000 mcg by mouth daily.    [provider]  diltiazem (TIAZAC) 360 MG 24 hr capsule Take 360 mg by mouth daily.    [provider]  escitalopram (LEXAPRO) 10 MG tablet Take 10 mg by mouth daily. 11/08/19   [provider]  finasteride (PROSCAR) 5 MG tablet Take 5 mg by mouth daily. 05/19/21   [provider]  levothyroxine (SYNTHROID) 150 MCG tablet Take 1 tablet (150 mcg total) by mouth daily. 07/15/11   Darlin Coco, MD  losartan (COZAAR) 100 MG tablet Take 1 tablet (100 mg total) by mouth daily. 02/22/13   Darlin Coco, MD  metoprolol tartrate (LOPRESSOR) 25 MG tablet Take 1 tablet (25 mg total) by mouth 2 (two) times daily. 09/14/21   Richardo Priest, MD  nitroGLYCERIN (NITROSTAT) 0.4 MG SL tablet Place 0.4 mg under the tongue every 5 (five) minutes as needed for chest pain.    [provider]  omeprazole (PRILOSEC) 40 MG capsule Take 40 mg by mouth daily.    [provider]  Probiotic Product (PROBIOTIC DAILY PO) Take 1 capsule by mouth daily.    [provider]  Propylene Glycol (SYSTANE BALANCE) 0.6 % SOLN Place 1 drop into both eyes as needed (eye irritations).    [provider]  rosuvastatin (CRESTOR) 5 MG tablet Take 1 tablet (5 mg total) by mouth daily. 03/02/11   Darlin Coco, MD  traMADol (ULTRAM) 50 MG tablet Take 1 tablet (50 mg total) by mouth every 6 (six) hours as needed. 07/17/21 07/17/22  Irine Seal, MD    Physical Exam    Vital Signs:  Miguel Morgan does not have vital signs available for review today.  Given telephonic nature of communication, physical exam is limited. AAOx3. NAD. Normal affect.  Speech and respirations are unlabored.  Accessory Clinical Findings    None  Assessment & Plan    1.  Preoperative Cardiovascular Risk Assessment: Left carpal tunnel release 12/30/2021, Dr. Roseanne Kaufman, [facility]      Primary Cardiologist: [name], MD  Chart reviewed as part of pre-operative protocol coverage. Given past medical history and time since last visit, based on ACC/AHA guidelines, Miguel Morgan would be at acceptable risk for the planned procedure without further cardiovascular testing.   Patient was advised that if he develops new symptoms prior to surgery to contact our office to arrange a follow-up appointment.  He verbalized understanding.  His RCRI is a class II risk, 0.9% risk of major cardiac event.  He is able to complete greater than 4 METS of physical activity.  Patient with diagnosis of afib on Eliquis for anticoagulation.     Procedure: left carpal tunnel release Date of procedure: 12/30/21   CHA2DS2-VASc Score = 4  This indicates a 4.8% annual risk of stroke. The patient's score is based upon: CHF History:  0 HTN History: 1 Diabetes History: 0 Stroke History: 0 Vascular Disease History: 1 Age Score: 2 Gender Score: 0   CrCl 16m/min using adjusted body weight Platelet count 201K   Per office protocol, patient can hold Eliquis for 1-2 days prior to procedure.    I will route this recommendation to the requesting party via Epic fax function and remove from pre-op pool.        Time:   Today, I have spent 5 minutes with the patient with telehealth technology discussing medical history, symptoms, and management plan.  Prior to his phone evaluation I spent greater than 10 minutes reviewing his past medical history and cardiac medications.   JDeberah Pelton NP  12/25/2021, 7:44 AM

## 2022-01-06 ENCOUNTER — Telehealth: Payer: Self-pay

## 2022-01-06 DIAGNOSIS — G5602 Carpal tunnel syndrome, left upper limb: Secondary | ICD-10-CM | POA: Diagnosis not present

## 2022-01-06 MED ORDER — APIXABAN 5 MG PO TABS: 5.0000 mg | ORAL_TABLET | Freq: Two times a day (BID) | ORAL | 3 refills | Status: DC

## 2022-01-06 NOTE — Telephone Encounter (Signed)
Renewal application for [place] patient assistance with Eliquis returned by patient. Provider portion completed and new Rx for Eliquis printed. Forms to provider for signature

## 2022-01-06 NOTE — Telephone Encounter (Signed)
Application faxed

## 2022-01-06 NOTE — Addendum Note (Signed)
Addended by: Darius Bump B on: 01/06/2022 02:00 PM   Modules accepted: Orders

## 2022-01-08 DIAGNOSIS — Z4789 Encounter for other orthopedic aftercare: Secondary | ICD-10-CM | POA: Diagnosis not present

## 2022-01-20 DIAGNOSIS — M25642 Stiffness of left hand, not elsewhere classified: Secondary | ICD-10-CM | POA: Diagnosis not present

## 2022-02-15 DIAGNOSIS — M5416 Radiculopathy, lumbar region: Secondary | ICD-10-CM | POA: Diagnosis not present

## 2022-03-05 DIAGNOSIS — Z0000 Encounter for general adult medical examination without abnormal findings: Secondary | ICD-10-CM | POA: Diagnosis not present

## 2022-03-05 DIAGNOSIS — K219 Gastro-esophageal reflux disease without esophagitis: Secondary | ICD-10-CM | POA: Diagnosis not present

## 2022-03-05 DIAGNOSIS — I480 Paroxysmal atrial fibrillation: Secondary | ICD-10-CM | POA: Diagnosis not present

## 2022-03-05 DIAGNOSIS — I10 Essential (primary) hypertension: Secondary | ICD-10-CM | POA: Diagnosis not present

## 2022-03-05 DIAGNOSIS — Z79899 Other long term (current) drug therapy: Secondary | ICD-10-CM | POA: Diagnosis not present

## 2022-03-05 DIAGNOSIS — E039 Hypothyroidism, unspecified: Secondary | ICD-10-CM | POA: Diagnosis not present

## 2022-03-05 DIAGNOSIS — Z1331 Encounter for screening for depression: Secondary | ICD-10-CM | POA: Diagnosis not present

## 2022-03-05 DIAGNOSIS — D510 Vitamin B12 deficiency anemia due to intrinsic factor deficiency: Secondary | ICD-10-CM | POA: Diagnosis not present

## 2022-03-05 DIAGNOSIS — F330 Major depressive disorder, recurrent, mild: Secondary | ICD-10-CM | POA: Diagnosis not present

## 2022-03-05 DIAGNOSIS — E782 Mixed hyperlipidemia: Secondary | ICD-10-CM | POA: Diagnosis not present

## 2022-03-05 DIAGNOSIS — I201 Angina pectoris with documented spasm: Secondary | ICD-10-CM | POA: Diagnosis not present

## 2022-03-05 DIAGNOSIS — Z9181 History of falling: Secondary | ICD-10-CM | POA: Diagnosis not present

## 2022-03-10 ENCOUNTER — Telehealth: Payer: Self-pay

## 2022-03-10 DIAGNOSIS — K219 Gastro-esophageal reflux disease without esophagitis: Secondary | ICD-10-CM | POA: Diagnosis not present

## 2022-03-10 DIAGNOSIS — K5730 Diverticulosis of large intestine without perforation or abscess without bleeding: Secondary | ICD-10-CM | POA: Diagnosis not present

## 2022-03-10 DIAGNOSIS — K2270 Barrett's esophagus without dysplasia: Secondary | ICD-10-CM | POA: Diagnosis not present

## 2022-03-10 NOTE — Telephone Encounter (Signed)
Notice of denial for Eliquis assistance from [employer]. Copy of letter in chart media and copy of letter was mailed to patient per company

## 2022-03-11 ENCOUNTER — Telehealth: Payer: Self-pay

## 2022-03-11 NOTE — Telephone Encounter (Signed)
   Pre-operative Risk Assessment    Patient Name: Miguel Morgan  DOB: 16-Nov-1940 MRN: [id number]      Request for Surgical Clearance    Procedure:  COLONOSCOPY AND  EGD  Date of Surgery:  Clearance 04/06/22                                 Surgeon:  Kathlene November [place] Surgeon's Group or Practice Name:  [facility] Phone number:  not listed Fax number:  [phone]   Type of Clearance Requested:   - Pharmacy:  Hold Apixaban (Eliquis) Eliquis   Type of Anesthesia:  Not Indicated   Additional requests/questions:    [name]   03/11/2022, 7:17 AM

## 2022-03-12 ENCOUNTER — Telehealth: Payer: Self-pay | Admitting: *Deleted

## 2022-03-12 ENCOUNTER — Telehealth: Payer: Self-pay | Admitting: Cardiology

## 2022-03-12 NOTE — Telephone Encounter (Signed)
Left a message to call back to schedule a tele pre op appt.  

## 2022-03-12 NOTE — Telephone Encounter (Signed)
Pt's wife returning Carol's call from today regarding a pre op appt. Please call back.

## 2022-03-12 NOTE — Telephone Encounter (Signed)
Primary Cardiologist:Brian Bettina Gavia, MD   Preoperative team, please contact this patient and set up a phone call appointment for further preoperative risk assessment. Please obtain consent and complete medication review. Thank you for your help.   I confirm that guidance regarding antiplatelet and oral anticoagulation therapy has been completed and, if necessary, noted below.   Emmaline Life, NP-C  03/12/2022, 1:47 PM [address], [address] Office [phone] Fax [phone]

## 2022-03-12 NOTE — Telephone Encounter (Signed)
I s/w the pt and his wife and they are both agreeable to tele pre op appt 03/22/22 @ 2 pm. Med rec and consent are done.        Patient Consent for Virtual Visit        Miguel Morgan has provided verbal consent on 03/12/2022 for a virtual visit (video or telephone).   CONSENT FOR VIRTUAL VISIT FOR:  Miguel Morgan  By participating in this virtual visit I agree to the following:  I hereby voluntarily request, consent and authorize [facility] and its employed or contracted physicians, physician assistants, nurse practitioners or other licensed health care professionals (the Practitioner), to provide me with telemedicine health care services (the "Services") as deemed necessary by the treating Practitioner. I acknowledge and consent to receive the Services by the Practitioner via telemedicine. I understand that the telemedicine visit will involve communicating with the Practitioner through live audiovisual communication technology and the disclosure of certain medical information by electronic transmission. I acknowledge that I have been given the opportunity to request an in-person assessment or other available alternative prior to the telemedicine visit and am voluntarily participating in the telemedicine visit.  I understand that I have the right to withhold or withdraw my consent to the use of telemedicine in the course of my care at any time, without affecting my right to future care or treatment, and that the Practitioner or I may terminate the telemedicine visit at any time. I understand that I have the right to inspect all information obtained and/or recorded in the course of the telemedicine visit and may receive copies of available information for a reasonable fee.  I understand that some of the potential risks of receiving the Services via telemedicine include:  Delay or interruption in medical evaluation due to technological equipment failure or disruption; Information  transmitted may not be sufficient (e.g. poor resolution of images) to allow for appropriate medical decision making by the Practitioner; and/or  In rare instances, security protocols could fail, causing a breach of personal health information.  Furthermore, I acknowledge that it is my responsibility to provide information about my medical history, conditions and care that is complete and accurate to the best of my ability. I acknowledge that Practitioner's advice, recommendations, and/or decision may be based on factors not within their control, such as incomplete or inaccurate data provided by me or distortions of diagnostic images or specimens that may result from electronic transmissions. I understand that the practice of medicine is not an exact science and that Practitioner makes no warranties or guarantees regarding treatment outcomes. I acknowledge that a copy of this consent can be made available to me via my patient portal ([facility]), or I can request a printed copy by calling the office of [facility].    I understand that my insurance will be billed for this visit.   I have read or had this consent read to me. I understand the contents of this consent, which adequately explains the benefits and risks of the Services being provided via telemedicine.  I have been provided ample opportunity to ask questions regarding this consent and the Services and have had my questions answered to my satisfaction. I give my informed consent for the services to be provided through the use of telemedicine in my medical care

## 2022-03-12 NOTE — Telephone Encounter (Signed)
Patient with diagnosis of afib on Eliquis for anticoagulation.    Procedure: COLONOSCOPY AND  EGD  Date of procedure: 04/06/22   CHA2DS2-VASc Score = 4   This indicates a 4.8% annual risk of stroke. The patient's score is based upon: CHF History: 0 HTN History: 1 Diabetes History: 0 Stroke History: 0 Vascular Disease History: 1 Age Score: 2 Gender Score: 0      CrCl 65 ml/min  Per office protocol, patient can hold Eliquis for 2 days prior to procedure.    **This guidance is not considered finalized until pre-operative APP has relayed final recommendations.**

## 2022-03-12 NOTE — Telephone Encounter (Signed)
I s/w the pt and his wife and they are both agreeable to tele pre op appt 03/22/22 @ 2 pm. Med rec and consent are done.       Patient Consent for Virtual Visit        [name] has provided verbal consent on 03/12/2022 for a virtual visit (video or telephone).   CONSENT FOR VIRTUAL VISIT FOR:  Miguel Morgan  By participating in this virtual visit I agree to the following:  I hereby voluntarily request, consent and authorize [facility] and its employed or contracted physicians, physician assistants, nurse practitioners or other licensed health care professionals (the Practitioner), to provide me with telemedicine health care services (the "Services") as deemed necessary by the treating Practitioner. I acknowledge and consent to receive the Services by the Practitioner via telemedicine. I understand that the telemedicine visit will involve communicating with the Practitioner through live audiovisual communication technology and the disclosure of certain medical information by electronic transmission. I acknowledge that I have been given the opportunity to request an in-person assessment or other available alternative prior to the telemedicine visit and am voluntarily participating in the telemedicine visit.  I understand that I have the right to withhold or withdraw my consent to the use of telemedicine in the course of my care at any time, without affecting my right to future care or treatment, and that the Practitioner or I may terminate the telemedicine visit at any time. I understand that I have the right to inspect all information obtained and/or recorded in the course of the telemedicine visit and may receive copies of available information for a reasonable fee.  I understand that some of the potential risks of receiving the Services via telemedicine include:  Delay or interruption in medical evaluation due to technological equipment failure or disruption; Information  transmitted may not be sufficient (e.g. poor resolution of images) to allow for appropriate medical decision making by the Practitioner; and/or  In rare instances, security protocols could fail, causing a breach of personal health information.  Furthermore, I acknowledge that it is my responsibility to provide information about my medical history, conditions and care that is complete and accurate to the best of my ability. I acknowledge that Practitioner's advice, recommendations, and/or decision may be based on factors not within their control, such as incomplete or inaccurate data provided by me or distortions of diagnostic images or specimens that may result from electronic transmissions. I understand that the practice of medicine is not an exact science and that Practitioner makes no warranties or guarantees regarding treatment outcomes. I acknowledge that a copy of this consent can be made available to me via my patient portal ([facility]), or I can request a printed copy by calling the office of [facility].    I understand that my insurance will be billed for this visit.   I have read or had this consent read to me. I understand the contents of this consent, which adequately explains the benefits and risks of the Services being provided via telemedicine.  I have been provided ample opportunity to ask questions regarding this consent and the Services and have had my questions answered to my satisfaction. I give my informed consent for the services to be provided through the use of telemedicine in my medical care

## 2022-03-22 ENCOUNTER — Ambulatory Visit: Payer: PPO

## 2022-03-23 NOTE — Progress Notes (Unsigned)
Virtual Visit via Telephone Note   Because of Miguel Morgan's co-morbid illnesses, he is at least at moderate risk for complications without adequate follow up.  This format is felt to be most appropriate for this patient at this time.  The patient did not have access to video technology/had technical difficulties with video requiring transitioning to audio format only (telephone).  All issues noted in this document were discussed and addressed.  No physical exam could be performed with this format.  Please refer to the patient's chart for his consent to telehealth for [facility].  Evaluation Performed:  Preoperative cardiovascular risk assessment _____________   Date:  03/23/2022   Patient ID:  Miguel Morgan, DOB 11-24-1940, MRN [id number] Patient Location:  Home Provider location:   Office  Primary Care Provider:  [name], MD Primary Cardiologist:  [name], MD  Chief Complaint / Patient Profile   82 y.o. y/o male with a h/o PAF, hypertension, hyperlipidemia, nonobstructive CAD per angiography who is pending colonoscopy and EGD by Dr. Lyda Jester and presents today for telephonic preoperative cardiovascular risk assessment.  History of Present Illness    Miguel Morgan is a 82 y.o. male who presents via audio/video conferencing for a telehealth visit today.  Pt was last seen in cardiology clinic on 11/23/2021 by Dr. Bettina Gavia.  At that time Miguel Morgan was doing well.  The patient is now pending procedure as outlined above. Since his last visit, he is doing well from a cardiac standpoint.Patient denies shortness of breath or dyspnea on exertion. No chest pain, pressure, or tightness. Denies lower extremity edema, orthopnea, or PND. No palpitations. He stays active working in his shop, walking outside about 1 mile a day with his wife, and doing yard work.   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 Normal EF.    CARPAL TUNNEL RELEASE Right 1984   cataract surgery  2009   with lens implant   COLONOSCOPY     CYSTOSCOPY WITH RETROGRADE PYELOGRAM, URETEROSCOPY AND STENT PLACEMENT Left 07/17/2021   Procedure: CYSTOSCOPY WITH RETROGRADE  URETEROSCOPY WITH HOLMIUM LASER AND STENT PLACEMENT;  Surgeon: Irine Seal, MD;  Location: [facility];  Service: Urology;  Laterality: Left;   [place]   KNEE SURGERY  2005   right - partial knee replacement   LITHOTRIPSY     LUMBAR [place] [facility]  04/15/2010   L2-3 central decompression, L4-5 left decompression. Dr. Lang Snow   TOTAL KNEE ARTHROPLASTY Left 06/09/2015   Procedure: TOTAL KNEE ARTHROPLASTY;  Surgeon: Vickey Huger, MD;  Location: [facility];  Service: Orthopedics;  Laterality: Left;  UMBILICAL HERNIA REPAIR  2018   VASECTOMY  1980    Allergies  Allergies  Allergen Reactions   Hydrocodone Other (See Comments)    Flushing and sweating    Home Medications    Prior to Admission medications   Medication Sig Start Date End Date Taking? Authorizing Provider  apixaban (ELIQUIS) 5 MG TABS tablet Take 1 tablet (5 mg total) by mouth 2 (two) times daily. 01/06/22   Richardo Priest, MD  Ascorbic Acid (VITAMIN C) 1000 MG tablet Take 1,000 mg by mouth daily.    [provider]  Cholecalciferol (VITAMIN D) 125 MCG (5000 UT) CAPS Take 5,000 Units by mouth daily.    [provider]  Cyanocobalamin (VITAMIN B-12) 5000 MCG TBDP Take 5,000 mcg by mouth daily.    [provider]  diltiazem (TIAZAC) 360 MG 24 hr capsule Take 360 mg by mouth daily.    [provider]  escitalopram (LEXAPRO) 10 MG tablet Take 10 mg by mouth daily. 11/08/19   [provider]  finasteride (PROSCAR) 5 MG tablet Take 5 mg by mouth daily. 05/19/21   [provider]  levothyroxine (SYNTHROID) 150 MCG tablet Take 1 tablet (150 mcg total) by mouth daily. 07/15/11   Darlin Coco, MD  losartan (COZAAR) 100 MG tablet Take 1 tablet (100 mg total) by mouth daily. 02/22/13   Darlin Coco, MD  metoprolol tartrate (LOPRESSOR) 25 MG tablet Take 1 tablet (25 mg total) by mouth 2 (two) times daily. 09/14/21   Richardo Priest, MD  nitroGLYCERIN (NITROSTAT) 0.4 MG SL tablet Place 0.4 mg under the tongue every 5 (five) minutes as needed for chest pain.    [provider]  omeprazole (PRILOSEC) 40 MG capsule Take 40 mg by mouth daily.    [provider]  Probiotic Product (PROBIOTIC DAILY PO) Take 1 capsule by mouth daily.    [provider]  Propylene Glycol (SYSTANE BALANCE) 0.6 % SOLN Place 1 drop into both eyes as needed (eye irritations).    [provider]  rosuvastatin (CRESTOR) 5 MG tablet Take 1 tablet (5 mg total) by mouth daily. 03/02/11   Darlin Coco, MD  traMADol (ULTRAM) 50 MG tablet Take 1 tablet (50 mg total) by mouth every 6 (six) hours as needed. 07/17/21 07/17/22  Irine Seal, MD    Physical Exam    Vital Signs:  Miguel Morgan does not have vital signs available for review today.  Given telephonic nature of communication, physical exam is limited. AAOx3. NAD. Normal affect.  Speech and respirations are unlabored.  Accessory Clinical Findings    None  Assessment & Plan    Primary  Cardiologist: [name], MD  Preoperative cardiovascular risk assessment.  Colonoscopy and EGD by Dr. Lyda Jester.  Chart reviewed as part of pre-operative protocol coverage. According to the RCRI, patient has a 0.4% risk of MACE. Patient reports activity equivalent to >4.0 METS (walking 1 mile a day outside, yard work).   Given past medical history and time since last visit, based on ACC/AHA guidelines, Miguel Morgan would be at acceptable risk for the planned procedure without further cardiovascular testing.   Patient was advised that if he develops new symptoms prior to surgery to contact our office to arrange a follow-up appointment.  he verbalized understanding.  2. Anti-coag.  Per Pharm.D.: Patient with diagnosis of afib on Eliquis for anticoagulation.     Procedure: COLONOSCOPY AND  EGD  Date of procedure: 04/06/22  CHA2DS2-VASc Score = 4   This indicates a 4.8% annual risk of stroke. The patient's score is based upon: CHF History: 0 HTN History: 1 Diabetes History: 0 Stroke History: 0 Vascular Disease History: 1 Age Score: 2 Gender Score: 0       CrCl 65 ml/min   Per office protocol, patient can hold Eliquis for 2 days prior to procedure.    I will route this recommendation to the requesting party via Epic fax function.  Please call with questions.  Time:   Today, I have spent 6 minutes with the patient with telehealth technology discussing medical history, symptoms, and management plan.     Mayra Reel, NP  03/23/2022, 6:25 PM

## 2022-03-24 ENCOUNTER — Ambulatory Visit: Payer: PPO | Attending: Cardiology | Admitting: Student

## 2022-03-24 DIAGNOSIS — Z01810 Encounter for preprocedural cardiovascular examination: Secondary | ICD-10-CM

## 2022-04-06 DIAGNOSIS — D126 Benign neoplasm of colon, unspecified: Secondary | ICD-10-CM | POA: Diagnosis not present

## 2022-04-06 DIAGNOSIS — I10 Essential (primary) hypertension: Secondary | ICD-10-CM | POA: Diagnosis not present

## 2022-04-06 DIAGNOSIS — K644 Residual hemorrhoidal skin tags: Secondary | ICD-10-CM | POA: Diagnosis not present

## 2022-04-06 DIAGNOSIS — K449 Diaphragmatic hernia without obstruction or gangrene: Secondary | ICD-10-CM | POA: Diagnosis not present

## 2022-04-06 DIAGNOSIS — Z800 Family history of malignant neoplasm of digestive organs: Secondary | ICD-10-CM | POA: Diagnosis not present

## 2022-04-06 DIAGNOSIS — D122 Benign neoplasm of ascending colon: Secondary | ICD-10-CM | POA: Diagnosis not present

## 2022-04-06 DIAGNOSIS — K2270 Barrett's esophagus without dysplasia: Secondary | ICD-10-CM | POA: Diagnosis not present

## 2022-04-06 DIAGNOSIS — Z79899 Other long term (current) drug therapy: Secondary | ICD-10-CM | POA: Diagnosis not present

## 2022-04-06 DIAGNOSIS — K635 Polyp of colon: Secondary | ICD-10-CM | POA: Diagnosis not present

## 2022-04-06 DIAGNOSIS — E039 Hypothyroidism, unspecified: Secondary | ICD-10-CM | POA: Diagnosis not present

## 2022-04-06 DIAGNOSIS — K648 Other hemorrhoids: Secondary | ICD-10-CM | POA: Diagnosis not present

## 2022-04-06 DIAGNOSIS — K229 Disease of esophagus, unspecified: Secondary | ICD-10-CM | POA: Diagnosis not present

## 2022-04-06 DIAGNOSIS — D124 Benign neoplasm of descending colon: Secondary | ICD-10-CM | POA: Diagnosis not present

## 2022-04-06 DIAGNOSIS — Z7901 Long term (current) use of anticoagulants: Secondary | ICD-10-CM | POA: Diagnosis not present

## 2022-04-06 DIAGNOSIS — Z86010 Personal history of colonic polyps: Secondary | ICD-10-CM | POA: Diagnosis not present

## 2022-04-06 DIAGNOSIS — Z87442 Personal history of urinary calculi: Secondary | ICD-10-CM | POA: Diagnosis not present

## 2022-04-06 DIAGNOSIS — K5730 Diverticulosis of large intestine without perforation or abscess without bleeding: Secondary | ICD-10-CM | POA: Diagnosis not present

## 2022-04-06 DIAGNOSIS — K5790 Diverticulosis of intestine, part unspecified, without perforation or abscess without bleeding: Secondary | ICD-10-CM | POA: Diagnosis not present

## 2022-04-06 DIAGNOSIS — K2970 Gastritis, unspecified, without bleeding: Secondary | ICD-10-CM | POA: Diagnosis not present

## 2022-04-06 DIAGNOSIS — K219 Gastro-esophageal reflux disease without esophagitis: Secondary | ICD-10-CM | POA: Diagnosis not present

## 2022-04-06 DIAGNOSIS — Z8673 Personal history of transient ischemic attack (TIA), and cerebral infarction without residual deficits: Secondary | ICD-10-CM | POA: Diagnosis not present

## 2022-04-06 DIAGNOSIS — Z885 Allergy status to narcotic agent status: Secondary | ICD-10-CM | POA: Diagnosis not present

## 2022-04-13 DIAGNOSIS — J329 Chronic sinusitis, unspecified: Secondary | ICD-10-CM | POA: Diagnosis not present

## 2022-04-13 DIAGNOSIS — Z6832 Body mass index (BMI) 32.0-32.9, adult: Secondary | ICD-10-CM | POA: Diagnosis not present

## 2022-04-13 DIAGNOSIS — J40 Bronchitis, not specified as acute or chronic: Secondary | ICD-10-CM | POA: Diagnosis not present

## 2022-04-13 DIAGNOSIS — H9190 Unspecified hearing loss, unspecified ear: Secondary | ICD-10-CM | POA: Diagnosis not present

## 2022-05-01 ENCOUNTER — Other Ambulatory Visit: Payer: Self-pay | Admitting: Cardiology

## 2022-05-03 NOTE — Telephone Encounter (Signed)
Prescription refill request for Eliquis received. Indication: PAF Last office visit: 12/25/21  Sherlean Foot NP Scr: 1.12 on 11/23/21  Epic Age: 82 Weight: 103.4kg  Based on above findings Eliquis 5mg  twice daily is the appropriate dose.  Refill approved.

## 2022-05-17 DIAGNOSIS — M5416 Radiculopathy, lumbar region: Secondary | ICD-10-CM | POA: Diagnosis not present

## 2022-05-25 DIAGNOSIS — J40 Bronchitis, not specified as acute or chronic: Secondary | ICD-10-CM | POA: Diagnosis not present

## 2022-05-25 DIAGNOSIS — J329 Chronic sinusitis, unspecified: Secondary | ICD-10-CM | POA: Diagnosis not present

## 2022-05-26 DIAGNOSIS — N200 Calculus of kidney: Secondary | ICD-10-CM | POA: Diagnosis not present

## 2022-05-26 DIAGNOSIS — R3915 Urgency of urination: Secondary | ICD-10-CM | POA: Diagnosis not present

## 2022-05-26 DIAGNOSIS — N401 Enlarged prostate with lower urinary tract symptoms: Secondary | ICD-10-CM | POA: Diagnosis not present

## 2022-06-09 DIAGNOSIS — L814 Other melanin hyperpigmentation: Secondary | ICD-10-CM | POA: Diagnosis not present

## 2022-06-09 DIAGNOSIS — C44519 Basal cell carcinoma of skin of other part of trunk: Secondary | ICD-10-CM | POA: Diagnosis not present

## 2022-06-09 DIAGNOSIS — L820 Inflamed seborrheic keratosis: Secondary | ICD-10-CM | POA: Diagnosis not present

## 2022-06-09 DIAGNOSIS — L578 Other skin changes due to chronic exposure to nonionizing radiation: Secondary | ICD-10-CM | POA: Diagnosis not present

## 2022-07-01 DIAGNOSIS — G4733 Obstructive sleep apnea (adult) (pediatric): Secondary | ICD-10-CM | POA: Diagnosis not present

## 2022-07-01 DIAGNOSIS — Z6831 Body mass index (BMI) 31.0-31.9, adult: Secondary | ICD-10-CM | POA: Diagnosis not present

## 2022-07-12 DIAGNOSIS — C44519 Basal cell carcinoma of skin of other part of trunk: Secondary | ICD-10-CM | POA: Diagnosis not present

## 2022-07-30 ENCOUNTER — Telehealth: Payer: Self-pay

## 2022-07-30 NOTE — Telephone Encounter (Signed)
Patient with diagnosis of afib on Eliquis for anticoagulation.    Procedure:  L4-L5 ESI  Date of procedure: TBD- STAT   CHA2DS2-VASc Score = 4   This indicates a 4.8% annual risk of stroke. The patient's score is based upon: CHF History: 0 HTN History: 1 Diabetes History: 0 Stroke History: 0 Vascular Disease History: 1 Age Score: 2 Gender Score: 0      CrCl 64 ml/min  Per office protocol, patient can hold Eliquis for 3 days prior to procedure.    **This guidance is not considered finalized until pre-operative APP has relayed final recommendations.**

## 2022-07-30 NOTE — Telephone Encounter (Signed)
   Primary Cardiologist: Norman Herrlich, MD  Chart reviewed as part of pre-operative protocol coverage. Given past medical history and time since last visit, based on ACC/AHA guidelines, Miguel Morgan would be at acceptable risk for the planned procedure without further cardiovascular testing.   Per office protocol, patient can hold Eliquis for 3 days prior to procedure.  He should resume as soon as hemodynamically stable following procedure, to be determined by requesting provider.  I will route this recommendation to the requesting party via Epic fax function and remove from pre-op pool.  Please call with questions.  Levi Aland, NP-C  07/30/2022, 12:50 PM [address] Office [phone] Fax [phone]

## 2022-07-30 NOTE — Telephone Encounter (Signed)
   Pre-operative Risk Assessment    Patient Name: Miguel Morgan  DOB: 1940-02-20 MRN: [id number]      Request for Surgical Clearance    Procedure:   L4-L5 ESI  Date of Surgery:  Clearance TBD                                 Surgeon:  Dr. Aileen Fass Surgeon's Group or Practice Name:  [facility] Phone number:  [phone] Fax number:  [phone]   Type of Clearance Requested:   - Pharmacy:  Hold Apixaban (Eliquis) 3 days prior to procedure and can resume blood thinner the day after surgery   Type of Anesthesia:  Not Indicated   Additional requests/questions:    Merlene Laughter   07/30/2022, 11:07 AM

## 2022-08-03 DIAGNOSIS — G4733 Obstructive sleep apnea (adult) (pediatric): Secondary | ICD-10-CM | POA: Diagnosis not present

## 2022-08-03 DIAGNOSIS — R0602 Shortness of breath: Secondary | ICD-10-CM | POA: Diagnosis not present

## 2022-08-06 DIAGNOSIS — R0602 Shortness of breath: Secondary | ICD-10-CM | POA: Diagnosis not present

## 2022-08-06 DIAGNOSIS — G4733 Obstructive sleep apnea (adult) (pediatric): Secondary | ICD-10-CM | POA: Diagnosis not present

## 2022-08-16 DIAGNOSIS — H6991 Unspecified Eustachian tube disorder, right ear: Secondary | ICD-10-CM | POA: Diagnosis not present

## 2022-08-16 DIAGNOSIS — H903 Sensorineural hearing loss, bilateral: Secondary | ICD-10-CM | POA: Diagnosis not present

## 2022-08-16 HISTORY — DX: Sensorineural hearing loss, bilateral: H90.3

## 2022-08-16 HISTORY — DX: Unspecified eustachian tube disorder, right ear: H69.91

## 2022-08-18 DIAGNOSIS — M5416 Radiculopathy, lumbar region: Secondary | ICD-10-CM | POA: Diagnosis not present

## 2022-08-27 DIAGNOSIS — G4733 Obstructive sleep apnea (adult) (pediatric): Secondary | ICD-10-CM | POA: Diagnosis not present

## 2022-08-27 DIAGNOSIS — Z6830 Body mass index (BMI) 30.0-30.9, adult: Secondary | ICD-10-CM | POA: Diagnosis not present

## 2022-08-27 DIAGNOSIS — J329 Chronic sinusitis, unspecified: Secondary | ICD-10-CM | POA: Diagnosis not present

## 2022-08-27 DIAGNOSIS — J309 Allergic rhinitis, unspecified: Secondary | ICD-10-CM | POA: Diagnosis not present

## 2022-08-27 DIAGNOSIS — J40 Bronchitis, not specified as acute or chronic: Secondary | ICD-10-CM | POA: Diagnosis not present

## 2022-08-28 ENCOUNTER — Other Ambulatory Visit: Payer: Self-pay | Admitting: Cardiology

## 2022-08-31 NOTE — Progress Notes (Unsigned)
Cardiology Office Note:    Date:  09/01/2022   ID:  Miguel Morgan, DOB [birth year], MRN [id number]  PCP:  Lise Auer, MD  Cardiologist:  Norman Herrlich, MD    Referring MD: Lise Auer, MD    ASSESSMENT:    1. Paroxysmal atrial fibrillation (HCC)   2. Chronic anticoagulation   3. Primary hypertension   4. Mixed hyperlipidemia    PLAN:    In order of problems listed above:  Maintaining sinus rhythm self-monitoring with an Apple Watch will reduce his anticoagulant to half dose with his extensive bruising and bleeding continue metoprolol and Cardizem maintaining sinus rhythm Well-controlled hypertension continue his ARB Continue with statin last lipid profile March this year LDL 72 cholesterol 150 creatinine 1.3   Next appointment: 6 months   Medication Adjustments/Labs and Tests Ordered: Current medicines are reviewed at length with the patient today.  Concerns regarding medicines are outlined above.  Orders Placed This Encounter  Procedures   EKG 12-Lead   No orders of the defined types were placed in this encounter.    History of Present Illness:    Miguel Morgan is a 82 y.o. 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  Compliance with diet, lifestyle and medications: Yes  He opted to continue anticoagulation but still complains of bruising and bleeding he is 82 his creatinine is elevated 1.3 and we will going to transition to reduced dose Eliquis especially with the maintaining sinus rhythm and monitoring with a smart watch He has not had no recurrent GI bleeding he has had multiple stones surgically removed He is not having edema shortness of breath orthopnea chest pain palpitation or syncope Past Medical  History:  Diagnosis Date   A-fib [facility])    Arthritis    Colon polyps    Coronary artery disease    nonobstructive CAD   Diverticulitis    GERD (gastroesophageal reflux disease)    History of kidney stones    Hyperlipidemia    Hypertension    Hypothyroid 06/29/2011   IBS (irritable bowel syndrome)    Lumbar spinal stenosis    Mild depression    Pernicious anemia    Primary osteoarthritis of left knee 06/04/2015   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   Cholecalciferol (VITAMIN D) 125 MCG (5000 UT) CAPS Take 5,000 Units by mouth daily.   Cyanocobalamin (VITAMIN B-12) 5000 MCG TBDP Take 5,000 mcg by mouth daily.   diltiazem (TIAZAC) 360 MG 24 hr capsule Take 360 mg by mouth daily.   escitalopram (LEXAPRO) 10 MG tablet Take 10 mg by mouth daily.   finasteride (PROSCAR) 5 MG tablet Take 5 mg by mouth daily.   levothyroxine (SYNTHROID) 150 MCG tablet Take 1 tablet (150 mcg total) by mouth daily.   losartan (COZAAR) 100 MG tablet Take 1 tablet (100 mg total) by mouth daily.   metoprolol tartrate (LOPRESSOR) 25 MG tablet Take 1 tablet (25 mg total) by mouth 2 (two) times daily.   nitroGLYCERIN (NITROSTAT) 0.4 MG SL tablet Place  0.4 mg under the tongue every 5 (five) minutes as needed for chest pain.   omeprazole (PRILOSEC) 40 MG capsule Take 40 mg by mouth daily.   Probiotic Product (PROBIOTIC DAILY PO) Take 1 capsule by mouth daily.   Propylene Glycol (SYSTANE BALANCE) 0.6 % SOLN Place 1 drop into both eyes as needed (eye irritations).   rosuvastatin (CRESTOR) 5 MG tablet Take 1 tablet (5 mg total) by mouth daily.   [DISCONTINUED] apixaban (ELIQUIS) 5 MG TABS tablet Take 1 tablet by mouth twice daily      EKGs/Labs/Other Studies Reviewed:    The following  studies were reviewed today:  EKG Interpretation Date/Time:  Wednesday September 01 2022 16:28:38 EDT Ventricular Rate:  63 PR Interval:  172 QRS Duration:  90 QT Interval:  390 QTC Calculation: 399 R Axis:   27  Text Interpretation: Normal sinus rhythm Normal ECG When compared with ECG of 01-Dec-1998 19:10, Nonspecific T wave abnormality, improved in Lateral leads Confirmed by Norman Herrlich ([id number]) on 09/01/2022 4:29:37 PM   Recent Labs: 11/23/2021: ALT 15; BUN 16; Creatinine, Ser 1.12; Potassium 4.1; Sodium 143  Recent Lipid Panel    Component Value Date/Time   CHOL 163 11/23/2021 1355   TRIG 265 (H) 11/23/2021 1355   HDL 48 11/23/2021 1355   CHOLHDL 3.4 11/23/2021 1355   LDLCALC 72 11/23/2021 1355    Physical Exam:    VS:  BP (!) 142/70 (BP Location: Right Arm, Patient Position: Sitting, Cuff Size: Normal)   Pulse 63   Ht 5\' 10"  (1.778 m)   Wt 221 lb 12.8 oz (100.6 kg)   SpO2 93%   BMI 31.82 kg/m     Wt Readings from Last 3 Encounters:  09/01/22 221 lb 12.8 oz (100.6 kg)  11/23/21 228 lb (103.4 kg)  07/08/21 219 lb (99.3 kg)     GEN:  Well nourished, well developed in no acute distress HEENT: Normal NECK: No JVD; No carotid bruits LYMPHATICS: No lymphadenopathy CARDIAC: RRR, no murmurs, rubs, gallops RESPIRATORY:  Clear to auscultation without rales, wheezing or rhonchi  ABDOMEN: Soft, non-tender, non-distended MUSCULOSKELETAL:  No edema; No deformity  SKIN: Warm and dry NEUROLOGIC:  Alert and oriented x 3 PSYCHIATRIC:  Normal affect    Signed, Norman Herrlich, MD  09/01/2022 4:56 PM    [facility]

## 2022-09-01 ENCOUNTER — Encounter: Payer: Self-pay | Admitting: Cardiology

## 2022-09-01 ENCOUNTER — Ambulatory Visit: Payer: PPO | Attending: Cardiology | Admitting: Cardiology

## 2022-09-01 VITALS — BP 142/70 | HR 63 | Ht 70.0 in | Wt 221.8 lb

## 2022-09-01 DIAGNOSIS — I10 Essential (primary) hypertension: Secondary | ICD-10-CM

## 2022-09-01 DIAGNOSIS — E782 Mixed hyperlipidemia: Secondary | ICD-10-CM

## 2022-09-01 DIAGNOSIS — I480 Paroxysmal atrial fibrillation: Secondary | ICD-10-CM

## 2022-09-01 DIAGNOSIS — Z7901 Long term (current) use of anticoagulants: Secondary | ICD-10-CM | POA: Diagnosis not present

## 2022-09-01 MED ORDER — APIXABAN 2.5 MG PO TABS: 2.5000 mg | ORAL_TABLET | Freq: Two times a day (BID) | ORAL | 3 refills | Status: DC

## 2022-09-01 NOTE — Patient Instructions (Addendum)
Medication Instructions:  Your physician has recommended you make the following change in your medication:   START: Eliquis 2.5 mg twice daily  *If you need a refill on your cardiac medications before your next appointment, please call your pharmacy*   Lab Work: None If you have labs (blood work) drawn today and your tests are completely normal, you will receive your results only by: MyChart Message (if you have MyChart) OR A paper copy in the mail If you have any lab test that is abnormal or we need to change your treatment, we will call you to review the results.   Testing/Procedures: None   Follow-Up: At [facility], you and your health needs are our priority.  As part of our continuing mission to provide you with exceptional heart care, we have created designated Provider Care Teams.  These Care Teams include your primary Cardiologist (physician) and Advanced Practice Providers (APPs -  Physician Assistants and Nurse Practitioners) who all work together to provide you with the care you need, when you need it.  We recommend signing up for the patient portal called "MyChart".  Sign up information is provided on this After Visit Summary.  MyChart is used to connect with patients for Virtual Visits (Telemedicine).  Patients are able to view lab/test results, encounter notes, upcoming appointments, etc.  Non-urgent messages can be sent to your provider as well.   To learn more about what you can do with MyChart, go to [email or web address].    Your next appointment:   6 month(s)  Provider:   Norman Herrlich, MD    Other Instructions None

## 2022-09-16 DIAGNOSIS — N476 Balanoposthitis: Secondary | ICD-10-CM | POA: Diagnosis not present

## 2022-09-16 DIAGNOSIS — N3001 Acute cystitis with hematuria: Secondary | ICD-10-CM | POA: Diagnosis not present

## 2022-09-27 DIAGNOSIS — G4733 Obstructive sleep apnea (adult) (pediatric): Secondary | ICD-10-CM | POA: Diagnosis not present

## 2022-10-27 DIAGNOSIS — G4733 Obstructive sleep apnea (adult) (pediatric): Secondary | ICD-10-CM | POA: Diagnosis not present

## 2022-11-03 DIAGNOSIS — L578 Other skin changes due to chronic exposure to nonionizing radiation: Secondary | ICD-10-CM | POA: Diagnosis not present

## 2022-11-03 DIAGNOSIS — L570 Actinic keratosis: Secondary | ICD-10-CM | POA: Diagnosis not present

## 2022-11-03 DIAGNOSIS — C44519 Basal cell carcinoma of skin of other part of trunk: Secondary | ICD-10-CM | POA: Diagnosis not present

## 2022-11-10 DIAGNOSIS — G4733 Obstructive sleep apnea (adult) (pediatric): Secondary | ICD-10-CM | POA: Diagnosis not present

## 2022-11-10 DIAGNOSIS — Z6830 Body mass index (BMI) 30.0-30.9, adult: Secondary | ICD-10-CM | POA: Diagnosis not present

## 2022-11-28 DIAGNOSIS — G4733 Obstructive sleep apnea (adult) (pediatric): Secondary | ICD-10-CM | POA: Diagnosis not present

## 2022-12-06 DIAGNOSIS — M5416 Radiculopathy, lumbar region: Secondary | ICD-10-CM | POA: Diagnosis not present

## 2022-12-11 ENCOUNTER — Other Ambulatory Visit: Payer: Self-pay | Admitting: Cardiology

## 2022-12-11 DIAGNOSIS — L820 Inflamed seborrheic keratosis: Secondary | ICD-10-CM | POA: Diagnosis not present

## 2022-12-11 DIAGNOSIS — L570 Actinic keratosis: Secondary | ICD-10-CM | POA: Diagnosis not present

## 2022-12-13 NOTE — Telephone Encounter (Signed)
RX sent

## 2022-12-14 ENCOUNTER — Other Ambulatory Visit: Payer: Self-pay | Admitting: Cardiology

## 2022-12-15 DIAGNOSIS — J069 Acute upper respiratory infection, unspecified: Secondary | ICD-10-CM | POA: Diagnosis not present

## 2022-12-15 DIAGNOSIS — Z6830 Body mass index (BMI) 30.0-30.9, adult: Secondary | ICD-10-CM | POA: Diagnosis not present

## 2022-12-27 DIAGNOSIS — G4733 Obstructive sleep apnea (adult) (pediatric): Secondary | ICD-10-CM | POA: Diagnosis not present

## 2023-01-04 DIAGNOSIS — W298XXA Contact with other powered powered hand tools and household machinery, initial encounter: Secondary | ICD-10-CM | POA: Diagnosis not present

## 2023-01-04 DIAGNOSIS — S61411A Laceration without foreign body of right hand, initial encounter: Secondary | ICD-10-CM | POA: Diagnosis not present

## 2023-01-04 DIAGNOSIS — S61412A Laceration without foreign body of left hand, initial encounter: Secondary | ICD-10-CM | POA: Diagnosis not present

## 2023-01-05 DIAGNOSIS — G4733 Obstructive sleep apnea (adult) (pediatric): Secondary | ICD-10-CM | POA: Diagnosis not present

## 2023-01-07 DIAGNOSIS — N200 Calculus of kidney: Secondary | ICD-10-CM | POA: Diagnosis not present

## 2023-01-07 DIAGNOSIS — R3121 Asymptomatic microscopic hematuria: Secondary | ICD-10-CM | POA: Diagnosis not present

## 2023-01-07 DIAGNOSIS — N3941 Urge incontinence: Secondary | ICD-10-CM | POA: Diagnosis not present

## 2023-01-27 DIAGNOSIS — G4733 Obstructive sleep apnea (adult) (pediatric): Secondary | ICD-10-CM | POA: Diagnosis not present

## 2023-02-27 DIAGNOSIS — G4733 Obstructive sleep apnea (adult) (pediatric): Secondary | ICD-10-CM | POA: Diagnosis not present

## 2023-03-02 ENCOUNTER — Encounter: Payer: Self-pay | Admitting: Cardiology

## 2023-03-02 NOTE — Progress Notes (Unsigned)
 Cardiology Office Note:    Date:  03/03/2023   ID:  Miguel Morgan, DOB [birth year], MRN [id number]  PCP:  Lise Auer, MD  Cardiologist:  Norman Herrlich, MD    Referring MD: Lise Auer, MD    ASSESSMENT:    1. Paroxysmal atrial fibrillation (HCC)   2. Primary hypertension   3. 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 Mixed hyperlipidemia    PLAN:    In order of problems listed above:  Miguel Morgan is doing well no clinical recurrence atrial fibrillation he will continue his rate limiting calcium channel blocker asked him get back in the habit of wearing a smart watch to self monitor and continue his reduced dose anticoagulant on the basis of age and renal function well-tolerated without recurrent hematuria Hypertension is well-controlled continue current treatment beta-blocker ARB calcium channel blocker Stable CAD having no anginal discomfort given a new prescription for nitroglycerin Continue his high intensity statin labs due in the next few weeks with his PCP   Next appointment: 1 year follow-up   Medication Adjustments/Labs and Tests Ordered: Current medicines are reviewed at length with the patient today.  Concerns regarding medicines are outlined above.  No orders of the defined types were placed in this encounter.  Meds ordered this encounter  Medications   nitroGLYCERIN (NITROSTAT) 0.4 MG SL tablet    Sig: Place 1 tablet (0.4 mg total) under the tongue every 5 (five) minutes as needed for chest pain.    Dispense:  25 tablet    Refill:  2     History of Present Illness:    Miguel Morgan is a 83 y.o. male with a hx of paroxysmal atrial fibrillation maintaining sinus rhythm chronic anticoagulation reduced dose Eliquis mild nonobstructive CAD hypertension and hyperlipidemia last seen 09/01/2022.  Compliance with diet, lifestyle and medications: Yes  Fortunately he tolerates his  reduced dose anticoagulant on the basis of age and renal function. 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   Arthritis of right wrist 11/18/2021   Carpal tunnel syndrome 11/18/2021   Colon polyps    Coronary artery disease    nonobstructive CAD   Diverticulitis    Dysfunction of right eustachian tube 08/16/2022   GERD (gastroesophageal reflux disease)    History of kidney stones    Hyperlipidemia    Hypertension    Hypothyroid 06/29/2011   IBS (irritable bowel syndrome)    Kidney stones    Lumbar spinal stenosis    Lumbar spondylosis 02/21/2014   Mild depression    Pernicious anemia    Pneumonia    Preop cardiovascular exam 04/09/2010   Primary osteoarthritis of left knee 06/04/2015   Rupture of flexor tendon of finger 11/18/2021   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   apixaban (ELIQUIS) 2.5 MG TABS tablet Take 1 tablet (2.5 mg total) by mouth 2 (two) times daily.   Ascorbic Acid (VITAMIN C) 1000 MG tablet Take 1,000 mg by mouth daily.   Cholecalciferol (VITAMIN D) 125 MCG (5000 UT) CAPS Take 5,000 Units by mouth daily.   Cyanocobalamin (VITAMIN  B-12) 5000 MCG TBDP Take 5,000 mcg by mouth daily.   diltiazem (TIAZAC) 360 MG 24 hr capsule Take 360 mg by mouth daily.   escitalopram (LEXAPRO) 10 MG tablet Take 10 mg by mouth daily.   finasteride (PROSCAR) 5 MG tablet Take 5 mg by mouth daily.   levothyroxine (SYNTHROID) 150 MCG tablet Take 1 tablet (150 mcg total) by mouth daily.   losartan (COZAAR) 100 MG tablet Take 1 tablet (100 mg total) by mouth daily.   metoprolol tartrate (LOPRESSOR) 25 MG tablet Take 1 tablet by mouth twice daily   omeprazole (PRILOSEC) 40 MG capsule Take 40 mg by mouth daily.   Probiotic Product (PROBIOTIC DAILY PO) Take 1 capsule by mouth daily.   Propylene Glycol (SYSTANE BALANCE) 0.6 % SOLN Place 1 drop into both eyes as needed (eye irritations).   rosuvastatin (CRESTOR) 5 MG tablet Take 1 tablet (5 mg total) by mouth daily.   [DISCONTINUED] nitroGLYCERIN (NITROSTAT) 0.4 MG SL tablet Place 0.4 mg under the tongue every 5 (five) minutes as needed for chest pain.      EKGs/Labs/Other Studies Reviewed:    The following studies were reviewed today:        No EKG today Recent Lipid Panel    Component Value Date/Time   CHOL 163 11/23/2021 1355   TRIG 265 (H) 11/23/2021 1355   HDL 48 11/23/2021 1355   CHOLHDL 3.4 11/23/2021 1355   LDLCALC 72 11/23/2021 1355    Physical Exam:    VS:  BP 130/72 (BP Location: Right Arm, Patient Position: Sitting, Cuff Size: Normal)   Pulse 64   Ht 5\' 11"  (1.803 m)   Wt 222 lb 12.8 oz (101.1 kg)   BMI 31.07 kg/m     Wt Readings from Last 3 Encounters:  03/03/23 222 lb 12.8 oz (101.1 kg)  09/01/22 221 lb 12.8 oz (100.6 kg)  11/23/21 228 lb (103.4 kg)     GEN: Looks younger than his age well nourished, well developed in no acute distress HEENT: Normal NECK: No JVD; No carotid bruits LYMPHATICS: No lymphadenopathy CARDIAC: RRR, no murmurs, rubs, gallops RESPIRATORY:  Clear to auscultation without rales, wheezing or rhonchi  ABDOMEN: Soft, non-tender,  non-distended MUSCULOSKELETAL:  No edema; No deformity  SKIN: Warm and dry NEUROLOGIC:  Alert and oriented x 3 PSYCHIATRIC:  Normal affect    Signed, Norman Herrlich, MD  03/03/2023 2:50 PM    [facility]

## 2023-03-03 ENCOUNTER — Encounter: Payer: Self-pay | Admitting: Cardiology

## 2023-03-03 ENCOUNTER — Ambulatory Visit: Payer: PPO | Attending: Cardiology | Admitting: Cardiology

## 2023-03-03 VITALS — BP 130/72 | HR 64 | Ht 71.0 in | Wt 222.8 lb

## 2023-03-03 DIAGNOSIS — E782 Mixed hyperlipidemia: Secondary | ICD-10-CM | POA: Diagnosis not present

## 2023-03-03 DIAGNOSIS — I10 Essential (primary) hypertension: Secondary | ICD-10-CM

## 2023-03-03 DIAGNOSIS — I25118 Atherosclerotic heart disease of native coronary artery with other forms of angina pectoris: Secondary | ICD-10-CM | POA: Diagnosis not present

## 2023-03-03 DIAGNOSIS — G4733 Obstructive sleep apnea (adult) (pediatric): Secondary | ICD-10-CM | POA: Diagnosis not present

## 2023-03-03 DIAGNOSIS — I480 Paroxysmal atrial fibrillation: Secondary | ICD-10-CM | POA: Diagnosis not present

## 2023-03-03 MED ORDER — NITROGLYCERIN 0.4 MG SL SUBL: 0.4000 mg | SUBLINGUAL_TABLET | SUBLINGUAL | 2 refills | Status: AC | PRN

## 2023-03-03 NOTE — Patient Instructions (Signed)
 Medication Instructions:  Your physician recommends that you continue on your current medications as directed. Please refer to the Current Medication list given to you today.  *If you need a refill on your cardiac medications before your next appointment, please call your pharmacy*   Lab Work: None If you have labs (blood work) drawn today and your tests are completely normal, you will receive your results only by: MyChart Message (if you have MyChart) OR A paper copy in the mail If you have any lab test that is abnormal or we need to change your treatment, we will call you to review the results.   Testing/Procedures: None   Follow-Up: At [facility], you and your health needs are our priority.  As part of our continuing mission to provide you with exceptional heart care, we have created designated Provider Care Teams.  These Care Teams include your primary Cardiologist (physician) and Advanced Practice Providers (APPs -  Physician Assistants and Nurse Practitioners) who all work together to provide you with the care you need, when you need it.  We recommend signing up for the patient portal called "MyChart".  Sign up information is provided on this After Visit Summary.  MyChart is used to connect with patients for Virtual Visits (Telemedicine).  Patients are able to view lab/test results, encounter notes, upcoming appointments, etc.  Non-urgent messages can be sent to your provider as well.   To learn more about what you can do with MyChart, go to [email or web address].    Your next appointment:   6 month(s)  Provider:   Norman Herrlich, MD    Other Instructions Call if PCP does not do yearly labs by March 15 th.

## 2023-03-09 DIAGNOSIS — M5416 Radiculopathy, lumbar region: Secondary | ICD-10-CM | POA: Diagnosis not present

## 2023-03-10 DIAGNOSIS — Z0000 Encounter for general adult medical examination without abnormal findings: Secondary | ICD-10-CM | POA: Diagnosis not present

## 2023-03-10 DIAGNOSIS — I480 Paroxysmal atrial fibrillation: Secondary | ICD-10-CM | POA: Diagnosis not present

## 2023-03-10 DIAGNOSIS — I201 Angina pectoris with documented spasm: Secondary | ICD-10-CM | POA: Diagnosis not present

## 2023-03-10 DIAGNOSIS — F330 Major depressive disorder, recurrent, mild: Secondary | ICD-10-CM | POA: Diagnosis not present

## 2023-03-10 DIAGNOSIS — E039 Hypothyroidism, unspecified: Secondary | ICD-10-CM | POA: Diagnosis not present

## 2023-03-10 DIAGNOSIS — E782 Mixed hyperlipidemia: Secondary | ICD-10-CM | POA: Diagnosis not present

## 2023-03-10 DIAGNOSIS — Z6831 Body mass index (BMI) 31.0-31.9, adult: Secondary | ICD-10-CM | POA: Diagnosis not present

## 2023-03-10 DIAGNOSIS — I10 Essential (primary) hypertension: Secondary | ICD-10-CM | POA: Diagnosis not present

## 2023-03-10 DIAGNOSIS — G4733 Obstructive sleep apnea (adult) (pediatric): Secondary | ICD-10-CM | POA: Diagnosis not present

## 2023-03-10 DIAGNOSIS — Z131 Encounter for screening for diabetes mellitus: Secondary | ICD-10-CM | POA: Diagnosis not present

## 2023-03-10 DIAGNOSIS — Z79899 Other long term (current) drug therapy: Secondary | ICD-10-CM | POA: Diagnosis not present

## 2023-03-16 DIAGNOSIS — Z6830 Body mass index (BMI) 30.0-30.9, adult: Secondary | ICD-10-CM | POA: Diagnosis not present

## 2023-03-16 DIAGNOSIS — E039 Hypothyroidism, unspecified: Secondary | ICD-10-CM | POA: Diagnosis not present

## 2023-03-16 DIAGNOSIS — E1165 Type 2 diabetes mellitus with hyperglycemia: Secondary | ICD-10-CM | POA: Diagnosis not present

## 2023-03-27 DIAGNOSIS — G4733 Obstructive sleep apnea (adult) (pediatric): Secondary | ICD-10-CM | POA: Diagnosis not present

## 2023-04-27 DIAGNOSIS — G4733 Obstructive sleep apnea (adult) (pediatric): Secondary | ICD-10-CM | POA: Diagnosis not present

## 2023-05-27 DIAGNOSIS — G4733 Obstructive sleep apnea (adult) (pediatric): Secondary | ICD-10-CM | POA: Diagnosis not present

## 2023-06-13 DIAGNOSIS — M5416 Radiculopathy, lumbar region: Secondary | ICD-10-CM | POA: Diagnosis not present

## 2023-06-20 DIAGNOSIS — R490 Dysphonia: Secondary | ICD-10-CM | POA: Diagnosis not present

## 2023-06-20 DIAGNOSIS — K2270 Barrett's esophagus without dysplasia: Secondary | ICD-10-CM | POA: Diagnosis not present

## 2023-06-20 DIAGNOSIS — K5790 Diverticulosis of intestine, part unspecified, without perforation or abscess without bleeding: Secondary | ICD-10-CM | POA: Diagnosis not present

## 2023-06-20 DIAGNOSIS — R1310 Dysphagia, unspecified: Secondary | ICD-10-CM | POA: Diagnosis not present

## 2023-06-20 DIAGNOSIS — K219 Gastro-esophageal reflux disease without esophagitis: Secondary | ICD-10-CM | POA: Diagnosis not present

## 2023-06-22 ENCOUNTER — Telehealth: Payer: Self-pay

## 2023-06-22 NOTE — Telephone Encounter (Signed)
   Pre-operative Risk Assessment    Patient Name: Miguel Morgan  DOB: 1940-03-11 MRN: [id number]   Date of last office visit: 03/03/23 Date of next office visit: 08/30/23   Request for Surgical Clearance    Procedure:  lumbar epidural  Date of Surgery:  Clearance TBD                                Surgeon:  Dr. Katheryn Pandy Surgeon's Group or Practice Name:  [facility] Phone number:  [phone] Fax number:  [phone]   Type of Clearance Requested:   - Pharmacy:  Hold Apixaban  (Eliquis ) 3 days prior and can resume after    Type of Anesthesia:  None    Additional requests/questions:    Gib Kurk   06/22/2023, 6:45 AM

## 2023-06-23 NOTE — Telephone Encounter (Signed)
 Patient with diagnosis of A Fib on Eliquis  for anticoagulation.    Procedure: lumbar epidural  Date of procedure: TBD   CHA2DS2-VASc Score = 4  {This indicates a 4.8% annual risk of stroke. The patient's score is based upon: CHF History: 0 HTN History: 1 Diabetes History: 0 Stroke History: 0 Vascular Disease History: 1 Age Score: 2 Gender Score: 0   CrCl 83   Per office protocol, patient can hold Eliquis  for 3 days prior to procedure.     **This guidance is not considered finalized until pre-operative APP has relayed final recommendations.**

## 2023-06-23 NOTE — Telephone Encounter (Signed)
     Primary Cardiologist: Zoe Hinds, MD  Clinical pharmacist have reviewed patient's chart and provided the following recommendations for , Miguel Morgan:  Patient with diagnosis of A Fib on Eliquis  for anticoagulation.     Procedure: lumbar epidural  Date of procedure: TBD     CHA2DS2-VASc Score = 4  {This indicates a 4.8% annual risk of stroke. The patient's score is based upon: CHF History: 0 HTN History: 1 Diabetes History: 0 Stroke History: 0 Vascular Disease History: 1 Age Score: 2 Gender Score: 0     CrCl 83     Per office protocol, patient can hold Eliquis  for 3 days prior to procedure.  I will route this recommendation to the requesting party via Epic fax function and remove from pre-op pool.  Please call with questions.  Chet Cota. Hakim Minniefield NP-C     06/23/2023, 3:08 PM [facility] [address] Office [phone] Fax [phone]

## 2023-06-24 DIAGNOSIS — K591 Functional diarrhea: Secondary | ICD-10-CM | POA: Diagnosis not present

## 2023-06-26 ENCOUNTER — Encounter (HOSPITAL_BASED_OUTPATIENT_CLINIC_OR_DEPARTMENT_OTHER): Payer: Self-pay | Admitting: Family Medicine

## 2023-06-26 ENCOUNTER — Ambulatory Visit (HOSPITAL_BASED_OUTPATIENT_CLINIC_OR_DEPARTMENT_OTHER)
Admission: EM | Admit: 2023-06-26 | Discharge: 2023-06-26 | Disposition: A | Discharge status: Home / Self Care | Attending: Family Medicine | Admitting: Family Medicine

## 2023-06-26 ENCOUNTER — Ambulatory Visit (HOSPITAL_BASED_OUTPATIENT_CLINIC_OR_DEPARTMENT_OTHER): Admitting: Radiology

## 2023-06-26 DIAGNOSIS — R1031 Right lower quadrant pain: Secondary | ICD-10-CM

## 2023-06-26 DIAGNOSIS — R1011 Right upper quadrant pain: Secondary | ICD-10-CM | POA: Diagnosis not present

## 2023-06-26 DIAGNOSIS — R1032 Left lower quadrant pain: Secondary | ICD-10-CM

## 2023-06-26 DIAGNOSIS — R1012 Left upper quadrant pain: Secondary | ICD-10-CM | POA: Diagnosis not present

## 2023-06-26 DIAGNOSIS — R110 Nausea: Secondary | ICD-10-CM

## 2023-06-26 DIAGNOSIS — R197 Diarrhea, unspecified: Secondary | ICD-10-CM

## 2023-06-26 DIAGNOSIS — R109 Unspecified abdominal pain: Secondary | ICD-10-CM | POA: Diagnosis not present

## 2023-06-26 MED ORDER — DICYCLOMINE HCL 20 MG PO TABS: 20.0000 mg | ORAL_TABLET | Freq: Four times a day (QID) | ORAL | 0 refills | Status: DC | PRN

## 2023-06-26 MED ORDER — ONDANSETRON 4 MG PO TBDP: 4.0000 mg | ORAL_TABLET | Freq: Three times a day (TID) | ORAL | 0 refills | Status: AC | PRN

## 2023-06-26 NOTE — ED Triage Notes (Signed)
 Provider Triaged.

## 2023-06-26 NOTE — Discharge Instructions (Addendum)
 Abdominal pain and diarrhea with some nausea: Abdominal x-ray shows a lot of gas in the bowel but no sign of a bowel obstruction or perforation.  Will update the patient and his wife if the radiologist has a different review of the film.  Ondansetron , 4 mg, melts on tongue, every 8 hours as needed for nausea.  Dicyclomine, 20 mg, 1 pill every 6 hours as needed for diarrhea or bowel spasms.  Bland food such as the bananas, rice, applesauce and toast diet or avoid food for a day or 2 and just keep hydrated with good fluid intake.  If symptoms persist and do not resolve, needs to see family practice.  May need stool cultures or other workup for more chronic diarrhea conditions.  Signs of dehydration: Dry mouth, not urinating at least once every 6 hours, weakness, dizziness, severe fatigue.  If any signs of dehydration, return here or go to the emergency room.  May need lab work or IV fluids.

## 2023-06-26 NOTE — ED Provider Notes (Signed)
 [facility]    CSN: [id number] Arrival date & time: 06/26/23  9182      History   Chief Complaint No chief complaint on file.   HPI Miguel Morgan is a 83 y.o. male.   Patient is here with his wife.  He has had diarrhea since 06/22/23.  He has had some nausea but no vomiting.  He is not eating much but liquids are going in and out. He is having diarrhea 4-6x daily.  He is having LLQ abdominal pain.  He has a history of diverticulitis.  His throat is hurting.      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/2021   Arthritis of right wrist 11/18/2021   Carpal tunnel syndrome 11/18/2021   Colon polyps    Coronary artery disease    nonobstructive CAD   Diverticulitis    Dysfunction of right eustachian tube 08/16/2022   GERD (gastroesophageal reflux disease)    History of kidney stones    Hyperlipidemia    Hypertension    Hypothyroid 06/29/2011   IBS (irritable bowel syndrome)    Kidney stones    Lumbar spinal stenosis    Lumbar spondylosis 02/21/2014   Mild depression    Pernicious anemia    Pneumonia    Preop cardiovascular exam 04/09/2010   Primary osteoarthritis of left knee 06/04/2015   Rupture of flexor tendon of finger 11/18/2021   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   Sleep apnea    uses cpap   Sprain of anterior talofibular ligament of right ankle 07/10/2019   Thyroid  disease    hypothyroidism      Patient Active Problem List   Diagnosis Date Noted   Dysfunction of right eustachian tube 08/16/2022   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   History of kidney stones 11/20/2021   A-fib (HCC) 11/20/2021   Rupture of flexor tendon of finger 11/18/2021   Carpal tunnel syndrome 11/18/2021   Arthritis of right wrist 11/18/2021   Arthritis of left wrist 11/18/2021   Thyroid  disease    Sleep apnea     Pneumonia    Pernicious anemia    Mild depression    Kidney stones    IBS (irritable bowel syndrome)    GERD (gastroesophageal reflux disease)    Diverticulitis    Colon polyps    Arthritis    Lumbar spinal stenosis    Sprain of anterior talofibular ligament of right ankle 07/10/2019   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 Normal EF.    CARPAL TUNNEL RELEASE Right 1984   cataract surgery  2009   with lens implant   COLONOSCOPY     CYSTOSCOPY WITH RETROGRADE PYELOGRAM, URETEROSCOPY AND STENT PLACEMENT Left 07/17/2021   Procedure: CYSTOSCOPY WITH RETROGRADE  URETEROSCOPY WITH HOLMIUM LASER AND STENT PLACEMENT;  Surgeon: Watt Rush, MD;  Location: [facility];  Service: Urology;  Laterality: Left;   HEMORRHOID SURGERY  1966   KNEE SURGERY  2005   right - partial knee replacement   LITHOTRIPSY     LUMBAR DISC SURGERY  04/15/2010   L2-3 central decompression, L4-5 left decompression. Dr. Ethridge   TOTAL KNEE ARTHROPLASTY Left 06/09/2015   Procedure: TOTAL KNEE ARTHROPLASTY;  Surgeon: Marcey Raman, MD;  Location: [facility];  Service: Orthopedics;  Laterality: Left;   UMBILICAL HERNIA REPAIR  2018   VASECTOMY  1980       Home Medications    Prior to Admission medications   Medication Sig Start Date End Date Taking? Authorizing Provider  ondansetron  (ZOFRAN -ODT) 4 MG disintegrating tablet Take 1 tablet (4 mg total) by mouth every 8 (eight) hours as needed for nausea or vomiting. 06/26/23  Yes Ival Domino, FNP  apixaban  (ELIQUIS ) 2.5 MG TABS tablet Take 1 tablet (2.5 mg total) by mouth 2 (two) times daily.  09/01/22   Monetta Redell PARAS, MD  Ascorbic Acid (VITAMIN C) 1000 MG tablet Take 1,000 mg by mouth daily.    [provider]  Cholecalciferol (VITAMIN D) 125 MCG (5000 UT) CAPS Take 5,000 Units by mouth daily.    [provider]  Cyanocobalamin  (VITAMIN B-12) 5000 MCG TBDP Take 5,000 mcg by mouth daily.    [provider]  dicyclomine (BENTYL) 20 MG tablet Take 1 tablet (20 mg total) by mouth every 6 (six) hours as needed for spasms (diarrhea). 06/26/23  Yes Ival Domino, FNP  diltiazem  (TIAZAC ) 360 MG 24 hr capsule Take 360 mg by mouth daily.    [provider]  escitalopram (LEXAPRO) 10 MG tablet Take 10 mg by mouth daily. 11/08/19   [provider]  finasteride (PROSCAR) 5 MG tablet Take 5 mg by mouth daily. 05/19/21   [provider]  levothyroxine  (SYNTHROID ) 150 MCG tablet Take 1 tablet (150 mcg total) by mouth daily. 07/15/11   Dominick Ned, MD  losartan  (COZAAR ) 100 MG tablet Take 1 tablet (100 mg total) by mouth daily. 02/22/13   Dominick Ned, MD  metoprolol  tartrate (LOPRESSOR ) 25 MG tablet Take 1 tablet by mouth twice daily 12/13/22   Monetta Redell PARAS, MD  nitroGLYCERIN  (NITROSTAT ) 0.4 MG SL tablet Place 1 tablet (0.4 mg total) under the tongue every 5 (five) minutes as needed for chest pain. 03/03/23   Monetta Redell PARAS, MD  omeprazole (PRILOSEC) 40 MG capsule Take 40 mg by mouth daily.    [provider]  Probiotic Product (PROBIOTIC DAILY PO) Take 1 capsule by mouth daily.    [provider]  Propylene Glycol (SYSTANE BALANCE) 0.6 % SOLN Place 1 drop into both eyes as needed (eye irritations).    [provider]  rosuvastatin  (CRESTOR ) 5 MG tablet Take 1 tablet (5 mg total) by mouth daily. 03/02/11   Dominick Ned, MD    Family History Family History  Problem Relation Age of Onset   Hypertension Mother    Heart attack Mother    Stomach cancer Father    Diabetes Sister    Hypertension Sister      Social History Social History   Tobacco Use   Smoking status: Never    Passive exposure: Never   Smokeless tobacco: Never  Vaping Use   Vaping status: Never Used  Substance Use Topics   Alcohol use: No   Drug use: No     Allergies   Hydrocodone    Review of Systems Review of Systems  Constitutional:  Negative for chills and fever.  HENT:  Positive for sore throat. Negative for ear  pain.   Eyes:  Negative for pain and visual disturbance.  Respiratory:  Negative for cough.   Cardiovascular:  Negative for chest pain and palpitations.  Gastrointestinal:  Positive for abdominal pain, diarrhea and nausea. Negative for constipation and vomiting.  Genitourinary:  Negative for dysuria and hematuria.  Musculoskeletal:  Negative for arthralgias and back pain.  Skin:  Negative for color change and rash.  Neurological:  Negative for seizures and syncope.  All other systems reviewed and are negative.    Physical Exam Triage Vital Signs ED Triage Vitals  Encounter Vitals Group     BP      Girls Systolic BP Percentile      Girls Diastolic BP Percentile      Boys Systolic BP Percentile      Boys Diastolic BP Percentile      Pulse      Resp      Temp      Temp src      SpO2      Weight      Height      Head Circumference      Peak Flow      Pain Score      Pain Loc      Pain Education      Exclude from Growth Chart    No data found.  Updated Vital Signs BP 107/70 (BP Location: Left Arm)   Pulse 78   Temp 98.5 F (36.9 C) (Oral) Comment: vitals obtained by Ival, NP  Resp 18   SpO2 96%   Visual Acuity Right Eye Distance:   Left Eye Distance:   Bilateral Distance:    Right Eye Near:   Left Eye Near:    Bilateral Near:     Physical Exam Vitals and nursing note reviewed.  Constitutional:      General: He is not in acute distress.    Appearance: He is well-developed. He is not ill-appearing or toxic-appearing.  HENT:     Head: Normocephalic and  atraumatic.     Right Ear: Tympanic membrane and ear canal normal. Decreased hearing (uses hearing aids) noted.     Left Ear: Tympanic membrane, ear canal and external ear normal. Decreased hearing (uses hearing aids) noted.     Nose: No congestion or rhinorrhea.     Right Sinus: No maxillary sinus tenderness or frontal sinus tenderness.     Left Sinus: No maxillary sinus tenderness or frontal sinus tenderness.     Mouth/Throat:     Lips: Pink.     Mouth: Mucous membranes are moist.     Pharynx: Uvula midline. No oropharyngeal exudate or posterior oropharyngeal erythema.     Tonsils: No tonsillar exudate.   Eyes:     Conjunctiva/sclera: Conjunctivae normal.     Pupils: Pupils are equal, round, and reactive to light.    Cardiovascular:     Rate and Rhythm: Normal rate and regular rhythm.     Heart sounds: S1 normal and S2 normal. No murmur heard. Pulmonary:     Effort: Pulmonary effort is normal. No respiratory distress.     Breath sounds: Normal breath sounds. No decreased breath sounds, wheezing, rhonchi or rales.  Abdominal:     General: Bowel sounds are normal.     Palpations: Abdomen is soft.     Tenderness: There is abdominal tenderness (mild to moderate) in the right upper quadrant, right lower quadrant, left upper quadrant and left lower quadrant.   Musculoskeletal:  General: No swelling.     Cervical back: Neck supple.  Lymphadenopathy:     Head:     Right side of head: No submental, submandibular, tonsillar, preauricular or posterior auricular adenopathy.     Left side of head: No submental, submandibular, tonsillar, preauricular or posterior auricular adenopathy.     Cervical: No cervical adenopathy.     Right cervical: No superficial cervical adenopathy.    Left cervical: No superficial cervical adenopathy.   Skin:    General: Skin is warm and dry.     Capillary Refill: Capillary refill takes less than 2 seconds.     Findings: No rash.   Neurological:      Mental Status: He is alert and oriented to person, place, and time.   Psychiatric:        Mood and Affect: Mood normal.      UC Treatments / Results  Labs (all labs ordered are listed, but only abnormal results are displayed) Labs Reviewed - No data to display  EKG   Radiology DG Abd 1 View Result Date: 06/26/2023 CLINICAL DATA:  Five day history of abdominal pain and diarrhea EXAM: ABDOMEN - 1 VIEW COMPARISON:  Abdominal radiograph dated 01/07/2023, CT abdomen and pelvis dated 05/18/2021 FINDINGS: Nonobstructive bowel gas pattern. Mild haustral thickening of the descending colon. No free air or pneumatosis. Irregular radiodensities project over the bilateral kidneys. No acute or substantial osseous abnormality. The sacrum and coccyx are partially obscured by overlying bowel contents. IMPRESSION: 1. Nonobstructive bowel gas pattern. Mild haustral thickening of the descending colon, which can be seen in the setting of colitis. 2. Irregular radiodensities project over the bilateral kidneys, which may correspond to the patient's known renal calculi. Electronically Signed   By: Limin  Xu M.D.   On: 06/26/2023 09:51    Procedures Procedures (including critical care time)  Medications Ordered in UC Medications - No data to display  Initial Impression / Assessment and Plan / UC Course  I have reviewed the triage vital signs and the nursing notes.  Pertinent labs & imaging results that were available during my care of the patient were reviewed by me and considered in my medical decision making (see chart for details).  Plan of Care: Colitis (abdominal pain, nausea and diarrhea): Ondansetron , 4 mg ODT, every 8 hours as needed for nausea.  Dicyclomine, 20 mg every 6 hours if needed for bowel spasms or diarrhea.  Educated about colitis.  Educated about dehydration and rehydration.  Is okay not to eat for a couple of days but must drink lots of fluids and may be some protein shakes to stay  hydrated.  Follow-up here if symptoms do not improve, worsen or new symptoms occur.  May need to go to an emergency room if concerned about significant dehydration.  I reviewed the plan of care with the patient and/or the patient's guardian.  The patient and/or guardian had time to ask questions and acknowledged that the questions were answered.  I provided instruction on symptoms or reasons to return here or to go to an ER, if symptoms/condition did not improve, worsened or if new symptoms occurred.  Final Clinical Impressions(s) / UC Diagnoses   Final diagnoses:  Left lower quadrant abdominal pain  Left upper quadrant abdominal pain  Right lower quadrant abdominal pain  Right upper quadrant abdominal pain  Diarrhea, unspecified type  Nausea without vomiting     Discharge Instructions      Abdominal pain and diarrhea with some nausea:  Abdominal x-ray shows a lot of gas in the bowel but no sign of a bowel obstruction or perforation.  Will update the patient and his wife if the radiologist has a different review of the film.  Ondansetron , 4 mg, melts on tongue, every 8 hours as needed for nausea.  Dicyclomine, 20 mg, 1 pill every 6 hours as needed for diarrhea or bowel spasms.  Bland food such as the bananas, rice, applesauce and toast diet or avoid food for a day or 2 and just keep hydrated with good fluid intake.  If symptoms persist and do not resolve, needs to see family practice.  May need stool cultures or other workup for more chronic diarrhea conditions.  Signs of dehydration: Dry mouth, not urinating at least once every 6 hours, weakness, dizziness, severe fatigue.  If any signs of dehydration, return here or go to the emergency room.  May need lab work or IV fluids.     ED Prescriptions     Medication Sig Dispense Auth. Provider   ondansetron  (ZOFRAN -ODT) 4 MG disintegrating tablet Take 1 tablet (4 mg total) by mouth every 8 (eight) hours as needed for nausea or vomiting. 20  tablet Jemel Ono, FNP   dicyclomine (BENTYL) 20 MG tablet Take 1 tablet (20 mg total) by mouth every 6 (six) hours as needed for spasms (diarrhea). 20 tablet Denson Niccoli, FNP      PDMP not reviewed this encounter.   Ival Domino, FNP 06/26/23 1004

## 2023-06-27 DIAGNOSIS — R197 Diarrhea, unspecified: Secondary | ICD-10-CM | POA: Diagnosis not present

## 2023-06-27 DIAGNOSIS — Z6826 Body mass index (BMI) 26.0-26.9, adult: Secondary | ICD-10-CM | POA: Diagnosis not present

## 2023-06-27 DIAGNOSIS — G4733 Obstructive sleep apnea (adult) (pediatric): Secondary | ICD-10-CM | POA: Diagnosis not present

## 2023-06-28 DIAGNOSIS — I480 Paroxysmal atrial fibrillation: Secondary | ICD-10-CM | POA: Diagnosis not present

## 2023-06-28 DIAGNOSIS — A419 Sepsis, unspecified organism: Secondary | ICD-10-CM | POA: Diagnosis not present

## 2023-06-28 DIAGNOSIS — E039 Hypothyroidism, unspecified: Secondary | ICD-10-CM | POA: Diagnosis not present

## 2023-06-28 DIAGNOSIS — R6520 Severe sepsis without septic shock: Secondary | ICD-10-CM | POA: Diagnosis not present

## 2023-06-28 DIAGNOSIS — I4891 Unspecified atrial fibrillation: Secondary | ICD-10-CM | POA: Diagnosis not present

## 2023-06-28 DIAGNOSIS — N179 Acute kidney failure, unspecified: Secondary | ICD-10-CM | POA: Diagnosis not present

## 2023-06-28 DIAGNOSIS — E8729 Other acidosis: Secondary | ICD-10-CM | POA: Diagnosis not present

## 2023-06-28 DIAGNOSIS — R109 Unspecified abdominal pain: Secondary | ICD-10-CM | POA: Diagnosis not present

## 2023-06-28 DIAGNOSIS — N400 Enlarged prostate without lower urinary tract symptoms: Secondary | ICD-10-CM | POA: Diagnosis not present

## 2023-06-28 DIAGNOSIS — E119 Type 2 diabetes mellitus without complications: Secondary | ICD-10-CM | POA: Diagnosis not present

## 2023-06-28 DIAGNOSIS — F32A Depression, unspecified: Secondary | ICD-10-CM | POA: Diagnosis not present

## 2023-06-28 DIAGNOSIS — E785 Hyperlipidemia, unspecified: Secondary | ICD-10-CM | POA: Diagnosis not present

## 2023-06-28 DIAGNOSIS — Z87442 Personal history of urinary calculi: Secondary | ICD-10-CM | POA: Diagnosis not present

## 2023-06-28 DIAGNOSIS — Z7901 Long term (current) use of anticoagulants: Secondary | ICD-10-CM | POA: Diagnosis not present

## 2023-06-28 DIAGNOSIS — I10 Essential (primary) hypertension: Secondary | ICD-10-CM | POA: Diagnosis not present

## 2023-06-28 DIAGNOSIS — A045 Campylobacter enteritis: Secondary | ICD-10-CM | POA: Diagnosis not present

## 2023-06-28 DIAGNOSIS — D649 Anemia, unspecified: Secondary | ICD-10-CM | POA: Diagnosis not present

## 2023-06-28 DIAGNOSIS — E876 Hypokalemia: Secondary | ICD-10-CM | POA: Diagnosis not present

## 2023-06-28 DIAGNOSIS — M1990 Unspecified osteoarthritis, unspecified site: Secondary | ICD-10-CM | POA: Diagnosis not present

## 2023-06-28 DIAGNOSIS — J029 Acute pharyngitis, unspecified: Secondary | ICD-10-CM | POA: Diagnosis not present

## 2023-06-28 DIAGNOSIS — E871 Hypo-osmolality and hyponatremia: Secondary | ICD-10-CM | POA: Diagnosis not present

## 2023-06-28 DIAGNOSIS — K5732 Diverticulitis of large intestine without perforation or abscess without bleeding: Secondary | ICD-10-CM | POA: Diagnosis not present

## 2023-06-28 DIAGNOSIS — K219 Gastro-esophageal reflux disease without esophagitis: Secondary | ICD-10-CM | POA: Diagnosis not present

## 2023-06-28 DIAGNOSIS — K5792 Diverticulitis of intestine, part unspecified, without perforation or abscess without bleeding: Secondary | ICD-10-CM | POA: Diagnosis not present

## 2023-06-28 DIAGNOSIS — E860 Dehydration: Secondary | ICD-10-CM | POA: Diagnosis not present

## 2023-06-28 DIAGNOSIS — Z885 Allergy status to narcotic agent status: Secondary | ICD-10-CM | POA: Diagnosis not present

## 2023-07-05 DIAGNOSIS — Z8619 Personal history of other infectious and parasitic diseases: Secondary | ICD-10-CM | POA: Diagnosis not present

## 2023-07-05 DIAGNOSIS — I10 Essential (primary) hypertension: Secondary | ICD-10-CM | POA: Diagnosis not present

## 2023-07-05 DIAGNOSIS — K5732 Diverticulitis of large intestine without perforation or abscess without bleeding: Secondary | ICD-10-CM | POA: Diagnosis not present

## 2023-07-05 DIAGNOSIS — N179 Acute kidney failure, unspecified: Secondary | ICD-10-CM | POA: Diagnosis not present

## 2023-07-07 DIAGNOSIS — Z6828 Body mass index (BMI) 28.0-28.9, adult: Secondary | ICD-10-CM | POA: Diagnosis not present

## 2023-07-07 DIAGNOSIS — I10 Essential (primary) hypertension: Secondary | ICD-10-CM | POA: Diagnosis not present

## 2023-07-21 DIAGNOSIS — N202 Calculus of kidney with calculus of ureter: Secondary | ICD-10-CM | POA: Diagnosis not present

## 2023-07-21 DIAGNOSIS — N281 Cyst of kidney, acquired: Secondary | ICD-10-CM | POA: Diagnosis not present

## 2023-07-21 DIAGNOSIS — N132 Hydronephrosis with renal and ureteral calculous obstruction: Secondary | ICD-10-CM | POA: Diagnosis not present

## 2023-07-21 DIAGNOSIS — K5730 Diverticulosis of large intestine without perforation or abscess without bleeding: Secondary | ICD-10-CM | POA: Diagnosis not present

## 2023-07-22 DIAGNOSIS — E1165 Type 2 diabetes mellitus with hyperglycemia: Secondary | ICD-10-CM | POA: Diagnosis not present

## 2023-07-22 DIAGNOSIS — D649 Anemia, unspecified: Secondary | ICD-10-CM | POA: Diagnosis not present

## 2023-07-22 DIAGNOSIS — E039 Hypothyroidism, unspecified: Secondary | ICD-10-CM | POA: Diagnosis not present

## 2023-07-25 DIAGNOSIS — E039 Hypothyroidism, unspecified: Secondary | ICD-10-CM | POA: Diagnosis not present

## 2023-07-25 DIAGNOSIS — Z6826 Body mass index (BMI) 26.0-26.9, adult: Secondary | ICD-10-CM | POA: Diagnosis not present

## 2023-07-25 DIAGNOSIS — I10 Essential (primary) hypertension: Secondary | ICD-10-CM | POA: Diagnosis not present

## 2023-07-25 DIAGNOSIS — K5900 Constipation, unspecified: Secondary | ICD-10-CM | POA: Diagnosis not present

## 2023-07-25 DIAGNOSIS — E782 Mixed hyperlipidemia: Secondary | ICD-10-CM | POA: Diagnosis not present

## 2023-07-25 DIAGNOSIS — I480 Paroxysmal atrial fibrillation: Secondary | ICD-10-CM | POA: Diagnosis not present

## 2023-07-25 DIAGNOSIS — E1169 Type 2 diabetes mellitus with other specified complication: Secondary | ICD-10-CM | POA: Diagnosis not present

## 2023-07-27 DIAGNOSIS — G4733 Obstructive sleep apnea (adult) (pediatric): Secondary | ICD-10-CM | POA: Diagnosis not present

## 2023-08-04 ENCOUNTER — Other Ambulatory Visit: Payer: Self-pay | Admitting: Urology

## 2023-08-04 DIAGNOSIS — N201 Calculus of ureter: Secondary | ICD-10-CM | POA: Diagnosis not present

## 2023-08-04 DIAGNOSIS — R1084 Generalized abdominal pain: Secondary | ICD-10-CM | POA: Diagnosis not present

## 2023-08-05 NOTE — Patient Instructions (Addendum)
 SURGICAL WAITING ROOM VISITATION  Patients having surgery or a procedure may have no more than 2 support people in the waiting area - these visitors may rotate.    Children under the age of 63 must have an adult with them who is not the patient.  Visitors with respiratory illnesses are discouraged from visiting and should remain at home.  If the patient needs to stay at the hospital during part of their recovery, the visitor guidelines for inpatient rooms apply. Pre-op nurse will coordinate an appropriate time for 1 support person to accompany patient in pre-op.  This support person may not rotate.    Please refer to the [facility] website for the visitor guidelines for Inpatients (after your surgery is over and you are in a regular room).       Your procedure is scheduled on: 08/11/23   Report to [facility] Main Entrance    Report to admitting at 7:15 AM   Call this number if you have problems the morning of surgery [phone]   Do not eat food or drink liquids:After Midnight. But may have sips of water to take meds.     Oral Hygiene is also important to reduce your risk of infection.                                    Remember - BRUSH YOUR TEETH THE MORNING OF SURGERY WITH YOUR REGULAR TOOTHPASTE   Stop all vitamins and herbal supplements 7 days before surgery.   Take these medicines the morning of surgery with A SIP OF WATER: diltiazem (tiazac ), escitaprolam(lexapro), finasteride(proscar), levothyroxine , metoprolol , omeprazole, rosuvastatin .               Do not take Losartan  the morning of surgery.  Bring CPAP mask and tubing day of surgery.                              You may not have any metal on your body including hair pins, jewelry, and body piercing             Do not wear make-up, lotions, powders, perfumes/cologne, or deodorant              Men may shave face and neck.   Do not bring valuables to the hospital. [facility] IS NOT              RESPONSIBLE   FOR VALUABLES.   Contacts, glasses, dentures or bridgework may not be worn into surgery.  DO NOT BRING YOUR HOME MEDICATIONS TO THE HOSPITAL. PHARMACY WILL DISPENSE MEDICATIONS LISTED ON YOUR MEDICATION LIST TO YOU DURING YOUR ADMISSION IN THE HOSPITAL!    Patients discharged on the day of surgery will not be allowed to drive home.  Someone NEEDS to stay with you for the first 24 hours after anesthesia.   Special Instructions: Bring a copy of your healthcare power of attorney and living will documents the day of surgery if you haven't scanned them before.              Please read over the following fact sheets you were given: IF YOU HAVE QUESTIONS ABOUT YOUR PRE-OP INSTRUCTIONS PLEASE CALL [phone] [place]   If you received a COVID test during your pre-op visit  it is requested that you wear a mask when out in public, stay away  from anyone that may not be feeling well and notify your surgeon if you develop symptoms. If you test positive for Covid or have been in contact with anyone that has tested positive in the last 10 days please notify you surgeon.    [facility] - Preparing for Surgery Before surgery, you can play an important role.  Because skin is not sterile, your skin needs to be as free of germs as possible.  You can reduce the number of germs on your skin by washing with CHG (chlorahexidine gluconate) soap before surgery.  CHG is an antiseptic cleaner which kills germs and bonds with the skin to continue killing germs even after washing. Please DO NOT use if you have an allergy to CHG or antibacterial soaps.  If your skin becomes reddened/irritated stop using the CHG and inform your nurse when you arrive at Short Stay. Do not shave (including legs and underarms) for at least 48 hours prior to the first CHG shower.  You may shave your face/neck.  Please follow these instructions carefully:  1.  Shower with CHG Soap the night before surgery and the  morning of  surgery.  2.  If you choose to wash your hair, wash your hair first as usual with your normal  shampoo.  3.  After you shampoo, rinse your hair and body thoroughly to remove the shampoo.                             4.  Use CHG as you would any other liquid soap.  You can apply chg directly to the skin and wash.  Gently with a scrungie or clean washcloth.  5.  Apply the CHG Soap to your body ONLY FROM THE NECK DOWN.   Do   not use on face/ open                           Wound or open sores. Avoid contact with eyes, ears mouth and   genitals (private parts).                       Wash face,  Genitals (private parts) with your normal soap.             6.  Wash thoroughly, paying special attention to the area where your    surgery  will be performed.  7.  Thoroughly rinse your body with warm water from the neck down.  8.  DO NOT shower/wash with your normal soap after using and rinsing off the CHG Soap.                9.  Pat yourself dry with a clean towel.            10.  Wear clean pajamas.            11.  Place clean sheets on your bed the night of your first shower and do not  sleep with pets. Day of Surgery : Do not apply any lotions/deodorants the morning of surgery.  Please wear clean clothes to the hospital/surgery center.  FAILURE TO FOLLOW THESE INSTRUCTIONS MAY RESULT IN THE CANCELLATION OF YOUR SURGERY  PATIENT SIGNATURE_________________________________  NURSE SIGNATURE__________________________________  ________________________________________________________________________

## 2023-08-05 NOTE — Progress Notes (Addendum)
 COVID Vaccine received:  []  No no Yes Date of any COVID positive Test in last 90 days:  PCP - Tracey Bathe MD Cardiologist - Redell Leiter MD  Chest x-ray -  EKG -  09/01/22 Epic Stress Test - 02/16/02 Epic ECHO - 04/15/21 Epic Cardiac Cath - 10/15/09 Epic  Bowel Prep - [x]  No  []   Yes ______  Pacemaker / ICD device [x]  No []  Yes   Spinal Cord Stimulator:[x]  No []  Yes       History of Sleep Apnea? []  No [x]  Yes   CPAP used?- []  No [x]  Yes    Does the patient monitor blood sugar?          []  No []  Yes  []  N/A  Patient has: [x]  NO Hx DM   []  Pre-DM                 []  DM1  []   DM2 Does patient have a [employer] or Dexacom? [x]  No []  Yes   Fasting Blood Sugar Ranges-  Checks Blood Sugar _____ times a day  GLP1 agonist / usual dose - no GLP1 instructions:  SGLT-2 inhibitors / usual dose - no SGLT-2 instructions:   Blood Thinner / Instructions:Eliquis . Last dose 08/08/22 7:30 AM But per wife, Urologist said pt. Can continue Eliquis  through surgery.  Comments:   Activity level: Patient is able  to climb a flight of stairs without difficulty; [x]  No CP  [x]  No SOB, but would have ___   Patient can  perform ADLs without assistance.   Anesthesia review: a-fib, HTN, on Eliquis , OSA, CAD  Patient denies shortness of breath, fever, cough and chest pain at PAT appointment.  Patient verbalized understanding and agreement to the Pre-Surgical Instructions that were given to them at this PAT appointment. Patient was also educated of the need to review these PAT instructions again prior to his/her surgery.I reviewed the appropriate phone numbers to call if they have any and questions or concerns.

## 2023-08-08 ENCOUNTER — Encounter (HOSPITAL_COMMUNITY): Payer: Self-pay

## 2023-08-08 ENCOUNTER — Encounter (HOSPITAL_COMMUNITY)
Admission: RE | Admit: 2023-08-08 | Discharge: 2023-08-08 | Disposition: A | Discharge status: Home / Self Care | Source: Ambulatory Visit | Attending: Urology | Admitting: Urology

## 2023-08-08 ENCOUNTER — Other Ambulatory Visit: Payer: Self-pay

## 2023-08-08 VITALS — BP 158/80 | HR 56 | Temp 98.0°F | Resp 18 | Ht 71.0 in | Wt 190.0 lb

## 2023-08-08 DIAGNOSIS — G4733 Obstructive sleep apnea (adult) (pediatric): Secondary | ICD-10-CM | POA: Diagnosis not present

## 2023-08-08 DIAGNOSIS — I4891 Unspecified atrial fibrillation: Secondary | ICD-10-CM | POA: Insufficient documentation

## 2023-08-08 DIAGNOSIS — I110 Hypertensive heart disease with heart failure: Secondary | ICD-10-CM | POA: Diagnosis not present

## 2023-08-08 DIAGNOSIS — Z01818 Encounter for other preprocedural examination: Secondary | ICD-10-CM

## 2023-08-08 DIAGNOSIS — I5032 Chronic diastolic (congestive) heart failure: Secondary | ICD-10-CM | POA: Insufficient documentation

## 2023-08-08 DIAGNOSIS — F32A Depression, unspecified: Secondary | ICD-10-CM | POA: Diagnosis not present

## 2023-08-08 DIAGNOSIS — Z7901 Long term (current) use of anticoagulants: Secondary | ICD-10-CM | POA: Insufficient documentation

## 2023-08-08 DIAGNOSIS — I2510 Atherosclerotic heart disease of native coronary artery without angina pectoris: Secondary | ICD-10-CM | POA: Diagnosis not present

## 2023-08-08 DIAGNOSIS — M19031 Primary osteoarthritis, right wrist: Secondary | ICD-10-CM | POA: Diagnosis not present

## 2023-08-08 DIAGNOSIS — E039 Hypothyroidism, unspecified: Secondary | ICD-10-CM | POA: Insufficient documentation

## 2023-08-08 DIAGNOSIS — K219 Gastro-esophageal reflux disease without esophagitis: Secondary | ICD-10-CM | POA: Insufficient documentation

## 2023-08-08 DIAGNOSIS — Z01812 Encounter for preprocedural laboratory examination: Secondary | ICD-10-CM | POA: Diagnosis not present

## 2023-08-08 DIAGNOSIS — I10 Essential (primary) hypertension: Secondary | ICD-10-CM

## 2023-08-08 DIAGNOSIS — N201 Calculus of ureter: Secondary | ICD-10-CM | POA: Diagnosis not present

## 2023-08-08 HISTORY — DX: Cardiac arrhythmia, unspecified: I49.9

## 2023-08-08 LAB — CBC
HCT: 38.9 % — ABNORMAL LOW (ref 39.0–52.0)
Hemoglobin: 12 g/dL — ABNORMAL LOW (ref 13.0–17.0)
MCH: 28.4 pg (ref 26.0–34.0)
MCHC: 30.8 g/dL (ref 30.0–36.0)
MCV: 92.2 fL (ref 80.0–100.0)
Platelets: 169 K/uL (ref 150–400)
RBC: 4.22 MIL/uL (ref 4.22–5.81)
RDW: 13.4 % (ref 11.5–15.5)
WBC: 8.3 K/uL (ref 4.0–10.5)
nRBC: 0 % (ref 0.0–0.2)

## 2023-08-08 LAB — BASIC METABOLIC PANEL WITH GFR
Anion gap: 7 (ref 5–15)
BUN: 18 mg/dL (ref 8–23)
CO2: 27 mmol/L (ref 22–32)
Calcium: 9.5 mg/dL (ref 8.9–10.3)
Chloride: 106 mmol/L (ref 98–111)
Creatinine, Ser: 0.76 mg/dL (ref 0.61–1.24)
GFR, Estimated: >60 mL/min (ref >60)
Glucose, Bld: 112 mg/dL — ABNORMAL HIGH (ref 70–99)
Potassium: 4.4 mmol/L (ref 3.5–5.1)
Sodium: 140 mmol/L (ref 135–145)

## 2023-08-09 ENCOUNTER — Encounter (HOSPITAL_COMMUNITY): Payer: Self-pay

## 2023-08-09 NOTE — Progress Notes (Signed)
 Case: [id number] Date/Time: 08/11/23 0915   Procedure: CYSTOSCOPY/URETEROSCOPY/HOLMIUM LASER/STENT PLACEMENT (Right)   Anesthesia type: General   Diagnosis: Calculus of ureter [N20.1]   Pre-op diagnosis: RIGHT URETERAL STONE   Location: [facility] / [facility]   Surgeons: Miguel Rush, MD       DISCUSSION: Miguel Morgan is an 83 yo male with has medical history of HTN, A-fib on Eliquis , HFpEF, nonobstructive CAD, OSA, (uses CPAP), GERD, hypothyroid, arthritis, depression.  Patient follows with cardiology for A-fib on Eliquis  and mild nonobstructive CAD (by cath in 2011).  Last seen in clinic on 03/03/2023 by Miguel Morgan.  Doing well at that time and in sinus rhythm.  He takes Eliquis  and metoprolol .  Advised follow-up in 1 year.    Patient will continue Eliquis  through periop period.  Per Dr. Watt.   VS: BP (!) 158/80   Pulse (!) 56   Temp 36.7 C (Oral)   Resp 18   Ht 5' 11 (1.803 m)   Wt 86.2 kg   SpO2 98%   BMI 26.50 kg/m   PROVIDERS: Miguel Morgan LABOR, MD   LABS: Labs reviewed: Acceptable for surgery. (all labs ordered are listed, but only abnormal results are displayed)  Labs Reviewed  BASIC METABOLIC PANEL WITH GFR - Abnormal; Notable for the following components:      Result Value   Glucose, Bld 112 (*)    All other components within normal limits  CBC - Abnormal; Notable for the following components:   Hemoglobin 12.0 (*)    HCT 38.9 (*)    All other components within normal limits     IMAGES:   EKG:   CV:  Echo 04/15/2021:  IMPRESSIONS    1. GLS -15.6. Left ventricular ejection fraction, by estimation, is 60 to 65%. The left ventricle has normal function. The left ventricle has no regional wall motion abnormalities. Left ventricular diastolic parameters are consistent with Grade II diastolic dysfunction (pseudonormalization).  2. Right ventricular systolic function is normal. The right ventricular size is normal.  3. The mitral valve is  normal in structure. No evidence of mitral valve regurgitation. No evidence of mitral stenosis.  4. The aortic valve is normal in structure. Aortic valve regurgitation is not visualized. Aortic valve sclerosis/calcification is present, without any evidence of aortic stenosis.  5. The inferior vena cava is normal in size with greater than 50% respiratory variability, suggesting right atrial pressure of 3 mmHg. Past Medical History:  Diagnosis Date   A-fib [facility])    Arthritis    Arthritis of left wrist 11/18/2021   Arthritis of right wrist 11/18/2021   Carpal tunnel syndrome 11/18/2021   Colon polyps    Coronary artery disease    nonobstructive CAD   Diverticulitis    Dysfunction of right eustachian tube 08/16/2022   Dysrhythmia    GERD (gastroesophageal reflux disease)    History of kidney stones    Hyperlipidemia    Hypertension    Hypothyroid 06/29/2011   IBS (irritable bowel syndrome)    Kidney stones    Lumbar spinal stenosis    Lumbar spondylosis 02/21/2014   Mild depression    Pernicious anemia    Pneumonia    Primary osteoarthritis of left knee 06/04/2015   Rupture of flexor tendon of finger 11/18/2021   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 Normal EF.    CARPAL TUNNEL RELEASE Right 1984   cataract surgery  2009   with lens implant   COLONOSCOPY     CYSTOSCOPY WITH RETROGRADE PYELOGRAM, URETEROSCOPY AND STENT PLACEMENT Left 07/17/2021   Procedure: CYSTOSCOPY WITH RETROGRADE  URETEROSCOPY WITH HOLMIUM LASER AND STENT PLACEMENT;  Surgeon: Miguel Rush, MD;  Location: [facility];  Service: Urology;  Laterality: Left;   HEMORRHOID SURGERY  1966    KNEE SURGERY  2005   right - partial knee replacement   LITHOTRIPSY     LUMBAR DISC SURGERY  04/15/2010   L2-3 central decompression, L4-5 left decompression. Dr. Ethridge   TOTAL KNEE ARTHROPLASTY Left 06/09/2015   Procedure: TOTAL KNEE ARTHROPLASTY;  Surgeon: Miguel Raman, MD;  Location: [facility] OR;  Service: Orthopedics;  Laterality: Left;   UMBILICAL HERNIA REPAIR  2018   VASECTOMY  1980    MEDICATIONS:  apixaban  (ELIQUIS ) 2.5 MG TABS tablet   Ascorbic Acid (VITAMIN C) 1000 MG tablet   Cholecalciferol (VITAMIN D) 125 MCG (5000 UT) CAPS   Cyanocobalamin  (VITAMIN B-12) 5000 MCG TBDP   dicyclomine  (BENTYL ) 20 MG tablet   escitalopram (LEXAPRO) 10 MG tablet   finasteride (PROSCAR) 5 MG tablet   fluticasone (FLONASE) 50 MCG/ACT nasal spray   levothyroxine  (SYNTHROID ) 137 MCG tablet   levothyroxine  (SYNTHROID ) 150 MCG tablet   losartan  (COZAAR ) 100 MG tablet   metoprolol  tartrate (LOPRESSOR ) 25 MG tablet   nitroGLYCERIN  (NITROSTAT ) 0.4 MG SL tablet   NON FORMULARY   omeprazole (PRILOSEC) 40 MG capsule   ondansetron  (ZOFRAN -ODT) 4 MG disintegrating tablet   Probiotic Product (PROBIOTIC DAILY PO)   Propylene Glycol (SYSTANE BALANCE) 0.6 % SOLN   rosuvastatin  (CRESTOR ) 5 MG tablet   No current facility-administered medications for this encounter.   Miguel Morgan/[facility] Stay/Anesthesiology [facility] Phone [phone] 08/09/2023 10:26 AM

## 2023-08-09 NOTE — Anesthesia Preprocedure Evaluation (Signed)
 Anesthesia Evaluation  Patient identified by MRN, date of birth, ID band Patient awake    Reviewed: Allergy & Precautions, NPO status , Patient's Chart, lab work & pertinent test results, reviewed documented beta blocker date and time   Airway Mallampati: II  TM Distance: >3 FB Neck ROM: Full    Dental no notable dental hx. (+) Teeth Intact, Dental Advisory Given   Pulmonary sleep apnea and Continuous Positive Airway Pressure Ventilation    Pulmonary exam normal breath sounds clear to auscultation       Cardiovascular hypertension, Pt. on home beta blockers and Pt. on medications + CAD  Normal cardiovascular exam+ dysrhythmias (in eliquis ) Atrial Fibrillation  Rhythm:Regular Rate:Normal  EKG: 04/24/21 Rate 56 bpm  Sinus bradycardia  Nonspecific T wave abnormality   CV: Echo 04/15/21 1. GLS -15.6. Left ventricular ejection fraction, by estimation, is 60 to  65%. The left ventricle has normal function. The left ventricle has no  regional wall motion abnormalities. Left ventricular diastolic parameters  are consistent with Grade II  diastolic dysfunction (pseudonormalization).  2. Right ventricular systolic function is normal. The right ventricular  size is normal.  3. The mitral valve is normal in structure. No evidence of mitral valve  regurgitation. No evidence of mitral stenosis.  4. The aortic valve is normal in structure. Aortic valve regurgitation is  not visualized. Aortic valve sclerosis/calcification is present, without  any evidence of aortic stenosis.  5. The inferior vena cava is normal in size with greater than 50%  respiratory variability, suggesting right atrial pressure of 3 mmHg.   Neuro/Psych  PSYCHIATRIC DISORDERS  Depression    negative neurological ROS     GI/Hepatic Neg liver ROS,GERD  ,,  Endo/Other  Hypothyroidism    Renal/GU negative Renal ROS  negative genitourinary    Musculoskeletal negative musculoskeletal ROS (+)    Abdominal   Peds  Hematology  (+) Blood dyscrasia, anemia   Anesthesia Other Findings   Reproductive/Obstetrics                              Anesthesia Physical Anesthesia Plan  ASA: 3  Anesthesia Plan: General   Post-op Pain Management: Tylenol  PO (pre-op)*   Induction: Intravenous  PONV Risk Score and Plan: 2 and Ondansetron , Dexamethasone  and Treatment may vary due to age or medical condition  Airway Management Planned: LMA  Additional Equipment: None  Intra-op Plan:   Post-operative Plan: Extubation in OR  Informed Consent: I have reviewed the patients History and Physical, chart, labs and discussed the procedure including the risks, benefits and alternatives for the proposed anesthesia with the patient or authorized representative who has indicated his/her understanding and acceptance.     Dental advisory given  Plan Discussed with: CRNA and Anesthesiologist  Anesthesia Plan Comments: (See PAT note from 8/4  Expand All Collapse All     Case: [id number] Date/Time: 08/11/23 0915   Procedure: CYSTOSCOPY/URETEROSCOPY/HOLMIUM LASER/STENT PLACEMENT (Right)   Anesthesia type: General   Diagnosis: Calculus of ureter [N20.1]   Pre-op diagnosis: RIGHT URETERAL STONE   Location: [facility]   Surgeons: Watt Rush, MD          DISCUSSION: Miguel Morgan is an 83 yo male with has medical history of HTN, A-fib on Eliquis , HFpEF, nonobstructive CAD, OSA, (uses CPAP), GERD, hypothyroid, arthritis, depression.   Patient follows with cardiology for A-fib on Eliquis  and mild nonobstructive CAD (by cath in  2011).  Last seen in clinic on 03/03/2023 by Dr. Monetta.  Doing well at that time and in sinus rhythm.  He takes Eliquis  and metoprolol .  Advised follow-up in 1 year.     Patient will continue Eliquis  through periop period.  Per Dr. Watt.  )         Anesthesia  Quick Evaluation

## 2023-08-10 NOTE — H&P (Signed)
 04/10/19: [name] was last seen in our office on 03/08/19 for urolitiasis follow up. He was placed on Ampicillin orally due to a positive urine culture for enteroccocus. He presents today for follow up. He has tolerated the medication course well and denies any adverse reactions. He reports a decrease in his urgency and frequency. he denies hematuria, dysuria, nausea, vomiting, fevers, flankpain and diarrhea. He is on his last day of the antibiotic.   11/08/19: 68-year-old male who presents today with left-sided flank discomfort that radiates into his left groin. He has a significant history of nephro and urolithiasis. Brought with him a stone he passed 1 week ago. He reports his symptoms have not gotten any better after passing the stone. He denies fevers and chills, he denies gross hematuria. He endorses dysuria, frequency, urgency and left flank discomfort. He does have some intermittent nausea. He has not taken anything for pain other than acetaminophen . He reports that this has been helpful. His last CT scan was in 2019.   12/18/19: 83 year old male who presents today for a one-month follow-up after treatment of UTI. He has not passed any stones since he his last office visit. He does have a large stone burden in bilaterally. He denies flank pain or discomfort today. He denies gross hematuria. He denies fevers and chills. Overall he is doing very well.   06/16/20: Miguel Morgan returns today in f/u for his history of stones and UTI's. His UA today has persistent pyuria but he has had no dysuria or hematuria. He has stable stones and renal cysts on US  today but no hydronephrosis. His IPSS is 5. He is having no flank pain and has passed no stones since his last f/u.   05/18/21: Miguel Morgan returns today with recurrent hematuria. He saw Dr. Dottie in January and was treated for prostatitis with antibiotics. He had near retention and constipation as his symptoms. He had no flank pain or fever. He then got OTC probiotic  in February. He was diagnosed with a fib and was started on Eliquis  in 4/23. He had a recurrent UTI with a positive culture and was changed to Cipro based on the results. He has had persistent hematuria since then particularly when he is active. His UA has >60 RBC's today but no WBC. He has no dysuria. He has mild frequency and nocturia 1-2x. He has a good stream and is emptying well. He has known bilateral renal stones with the last imaging a renal US  in 6/22 that showed stable cysts and multiple stones.   06/17/21: Miguel Morgan returns today with recurrent hematuria that has worsened since he started Eliquis  for afib about 2 months ago. He has some left flank pain at night when he is sleeping. He has no clots. He has dysuria. His IPSS is 7 with frequency. He has nocturia x 1. He has been really tired. He was not anemic in February at the time of his last labs. He had a CT in 5/23 and has stable bilateral renal stones, left > right, and renal cysts. He has a large prostate. He last had cystoscopy in 2019. His UA today has >60 RBC's.   07/23/2021: Following evaluation for gross hematuria he was noted to have bloody E flux from the left ureteral orifice and his known left-sided stone burden was thought to be the cause. Patient was taken for cystoscopy and left ureteroscopy on 7/14 with Dr. Watt. Please review the operative note for further details but essentially he had a couple of stones  removed from the bladder. There were no ureteral calculi identified on the left side. He had a large Randall's plaque in the midpole calyx, a 12 mm stone in the lower pole infundibulum, and stones proximal to that in the calyx. All of these were appropriately treated with the laser and all visible fragments that were visible following treatment were removed. A left ureteral stent was placed. He did have bleeding from veins in the prostatic urethra after scope removal so a 20 French Foley catheter was placed on traction at the  conclusion of the procedure, removed prior to hospital discharge. He is here today for follow-up exam with cystoscopy and stent removal.   Patient has done well following the procedure. He has not had recurrence of gross hematuria and restarted anticoagulation therapy approximately 2 days ago. He denies any dysuria. He has not had significant painful or leaking urgency, left-sided pain or discomfort suggestive of obstructive uropathy. He denies interval fevers or chills, nausea/vomiting.   11/25/21: Miguel Morgan returns today in f/u for his ihstory of stones and hematuria with left URS on 07/17/21 for bloody efflux on the left secondary to renal stones. The stent was removed on 07/23/21. He is doing without out hematuria. His IPSS is 8 with nocturia x 1. KUB and renal US  today shows some small renal stones and renal cysts but no ureteral stones or obstruction. He remains on finasteride for BPH.   05/26/22: Miguel Morgan returns today in f/u for his history of stones. KUB today shows small renal stones. His UA is clear. He passed a 4mm stone yesterday and had no pain. He remains on Eliquis . he has mixed calcium  oxalate stones.   09/16/2022: Patient seen today for evaluation of gross hematuria. Current symptoms present for the past 2 weeks. He endorses seeing gross hematuria and having dysuria. Also constitutionally not feeling well but no correlating documented fever/chills or nausea/vomiting. He's had some some intermittent left flank and lower abdominal pain similar to prior stone events but not to the severity or frequency as before. Currently not having pain or discomfort unilaterally. He denies interval stone material passage. His constitutional symptoms have improved but he is continuing to have some increased nocturia, dysuria and gross hematuria. He also describes a rash on the head of his penis.   01/07/23: Miguel Morgan returns today in f/u. He passed a large stone a week or so after his last visit and the bleeding and  pain have resolved.   07/21/23: Miguel Morgan returns today in f/u. On 06/22/23 he began to have diarrhea and he was admitted for food poisoning and diverticulitis. He went home on 07/01/23. On 07/07/23 and had gross hematuria and right flank pain that was severe through the weekend. HE got some relief with Ibuprofen but he has continued to have some nausea without vomiting and some pain. HE had a CT on 6/24 and had a large RLP stone and smaller bilateral renal stones and cysts. He is on Eliquis .   08/04/2023: 83 year old man who presents for follow-up of right-sided ureteral stones. He continues to have intermittent discomfort. He denies fevers and chills. He is on Eliquis .     ALLERGIES: OxyCODONE  HCl TABS - broke out in sweat per pt    MEDICATIONS: Finasteride 5 MG Tablet 1 tablet PO Daily  Metoprolol  Succinate ER 25 MG Tablet Extended Release 24 Hour  Alfuzosin HCl ER 10 MG Tablet Extended Release 24 Hour 1 tablet PO Daily  Atenolol  50 MG Tablet Oral  Clotrimazole-Betamethasone  1-0.05 % Cream Apply  thin layer to affected area twice daily  dilTIAZem  HCl ER Coated Beads 360 MG Capsule Extended Release 24 Hour  Eliquis  2.5 MG Tablet tablet  Escitalopram Oxalate  Losartan  Potassium 100 MG Tablet  Omeprazole 40 MG Capsule Delayed Release Oral  Probiotic  Rosuvastatin  Calcium   Synthroid  150 MCG Tablet Oral  Vitamin B-12  Vitamin C  Vitamin D (Cholecalciferol) 25 MCG (1000 UT) Capsule Oral     GU PSH: Cysto Remove Stent FB Sim - 08-27-2021 Cystoscopy - 08/27/2021, 08-27-2017 Cystoscopy Ureteroscopy - 08-27-12 ESWL - 08-27-2012 Rpr Umbil Hern; Reduc < 5 Yr - 27-Aug-2012 Ureteroscopic laser litho - Aug 27, 2021       PSH Notes: Nerve Block Transforaminal Epidural,  Cystoscopy With Ureteroscopy, Lithotripsy,  Umbilical Hernia Repair,  Back Surgery,  Arthroscopy 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 Partial rt knee replacement   NON-GU PSH: Knee Arthroscopy; Dx - 08-27-12 Knee replacement, Left Visit Complexity (formerly GPC1X) -  07/21/2023, 01/07/2023, 09/16/2022, 05/26/2022     GU PMH: Flank Pain, He has a 5x5x7mm cluster of stones in the right mid ureter with mild obstruction that were there on 06/28/23 on his last CT. They are not well seen on KUB. I discussed options and since they have moved to the mid ureter in 3 weeks, I think a trial of MET is worthwhile. I will give him hydromorphone  2mg  q6prn and alfuzosin. He will hold his evening metoprolol  while starting the afluzosin to reduce the risk of hypotension. He will return in 2 weeks and if he hasn't passed the stone, he may need a KUB or CT. - 07/21/2023 Gross hematuria (Stable) - 07/21/2023, 27-Aug-2021, He has gross hematuria that has been persistent since January. It is coming form the right renal system and is probably from the stones and his use of Eliquis . I discussed ureteroscopy and ESWL and will get him set up for URS. I have reviewed the risks of ureteroscopy including bleeding, infection, ureteral injury, need for a stent or secondary procedures, thrombotic events and anesthetic complications. I will get him cleared by cardiology. , Aug 27, 2021 (Stable), He has persistent hematuria on Eliquis  despite treatment of the recent UTI. his stones are stable and non-obstructing and there is no obvious blood product in the collecting system. , 08-27-21, Aug 27, 2017, He has stable bilateral renal stones and cysts. I am going to have him hold the ASA and will have him return in 2 weeks for a cystoscopy. , 08-27-2017, Gross hematuria, - 28-Aug-2014 Renal calculus, He has bilateral renal stones. - 07/21/2023, He passed a large stone from the left and has residual small renal stones. I will have him return in 6 months with a KUB. , - 01/07/2023, He has stable renal stones but did pass a stone yesterday with minimal symptoms. f/u in 1 year with a KUB. , - 05/26/2022, His stone burden is reduced following ureteroscopy. F/u in 6 months with a KUB. , - 11/25/2021 (Stable), - 2021/08/27, 08-27-2021, 2021/08/27, He has stable stones on  RUS today. I will have him return in 1 year with a KUB and renal US . , Aug 27, 2020, 08-28-2019, He has stable left renal stones. I don't see right renal stones today. he will return in a year with a KUB. , 08-28-19, He has passed a couple of stones and has no further hematuria. He will return in 6 months with a KUB. , August 28, 2018, He has had no stone symptoms and his UA is  clear. I will repeat a KUB in a year. , - 2018, He passed a stone about 6 weeks ago. , - 2017, Bilateral kidney stones, - 2016, Calculus of kidney, - 2014 Ureteral calculus - 07/21/2023 Microscopic hematuria (Stable), He has 3-10 RBC's which is low for him. - 01/07/2023, - 2021, - 2021, - 2021 Urge incontinence - 01/07/2023 Urinary Urgency (Stable), He has mild LUTS with some urgency and rare UUI but not enough to change therapy. 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 - 05/26/2022, He is voiding well on finasteride. , - 11/25/2021, He has stable LUTS on current therapy and will continue that. There was no prostatic bleeding. , - 2023, He has a large prostate with mild LUTS but the prostate could be the source of the bleeding, I will try him on finasteride and reviewed the side effects in detail. If the bleeding persists, he may need to hold the eliquis  for a few days. I will have him return in a month and will do cystoscopy. , - 2023, Benign prostatic hyperplasia with urinary obstruction, - 2016 Renal cyst - 11/25/2021, He has large bilateral simple renal cysts that are stable since the last CT. , - 2023, Bilateral renal cysts are stable. , - 2022, Renal cysts, acquired, bilateral, - 2016 Urinary Frequency - 11/25/2021, - 2023 Weak Urinary Stream - 2023, - 2018 Chronic cystitis (w/o hematuria), He has persistent pyuria. I will get a culture today but not treat unless he becomes symptomatic. - 2022 Acute Cystitis/UTI - 2021,  - 2021 LLQ pain - 2021, Abdominal pain, LLQ (left lower quadrant), - 2016 Elevated PSA, His PSA remains normal and in his prior range. He doesn't need that done again. - 2019, His PSA remains low. He will f/u with a PSA in a year. , - 2018 (Improving), His PSA continues to fall, - 2017, Elevated prostate specific antigen (PSA), - 2015 Urinary Hesitancy, Hesitancy - 2016 Other microscopic hematuria, Microscopic hematuria - 2016 Prostate nodule w/o LUTS, Nodular prostate without lower urinary tract symptoms - 2016 Prostate Stones, Calculus of prostate - 2016 Prostate nodule w/ LUTS, Nodular prostate with lower urinary tract symptoms - 2015      PMH Notes: Barretts esophagus found 02-2018  COVID 10/20.   NON-GU PMH: Encounter for general adult medical examination without abnormal findings, Encounter for preventive health examination - 2016 Personal history of other diseases of the digestive system, History of diverticulitis of colon - 2016 Personal history of other diseases of the circulatory system, History of hypertension - 2014 Personal history of other diseases of the musculoskeletal system and connective tissue, History of arthritis - 2014 Personal history of other diseases of the nervous system and sense organs, History of sleep apnea - 2014 Personal history of other endocrine, nutritional and metabolic disease, History of hypercholesterolemia - 2014, History of hyperthyroidism, - 2014 Barrett's esophagus without dysplasia    FAMILY HISTORY: Hypertension - Runs In Family   SOCIAL HISTORY: Marital Status: Married Preferred Language: English; Ethnicity: Not Hispanic Or Latino; Race: White Patient's occupation is/was retired.     Notes: Alcohol use, Caffeine use, Father deceased, Mother deceased, Retired, Married, Never a smoker   REVIEW OF SYSTEMS:    GU Review Male:   Patient reports frequent urination and hard to postpone urination. Patient denies burning/ pain with urination, get up  at night to urinate, leakage of urine,  stream starts and stops, trouble starting your stream, have to strain to urinate , erection problems, and penile pain.  Gastrointestinal (Upper):   Patient denies nausea, vomiting, and indigestion/ heartburn.  Gastrointestinal (Lower):   Patient denies diarrhea and constipation.  Constitutional:   Patient denies fever, night sweats, weight loss, and fatigue.  Cardiovascular:   Patient denies leg swelling and chest pains.  Musculoskeletal:   Patient denies back pain and joint pain.  Neurological:   Patient denies headaches and dizziness.  Psychologic:   Patient denies depression and anxiety.   VITAL SIGNS:      08/04/2023 01:10 PM  BP 129/74 mmHg  Heart Rate 72 /min  Temperature 97.8 F / 36.5 C   GU PHYSICAL EXAMINATION:      Notes: Right CVA tenderness   MULTI-SYSTEM PHYSICAL EXAMINATION:    Constitutional: Well-nourished. No physical deformities. Normally developed. Good grooming.  Cardiovascular: Normal temperature, normal extremity pulses, no swelling, no varicosities.  Skin: No paleness, no jaundice, no cyanosis. No lesion, no ulcer, no rash.  Neurologic / Psychiatric: Oriented to time, oriented to place, oriented to person. No depression, no anxiety, no agitation.  Gastrointestinal: No mass, no tenderness, no rigidity, non obese abdomen.     Complexity of Data:  Source Of History:  Patient  Records Review:   Previous Doctor Records, Previous Patient Records  Urine Test Review:   Urinalysis   11/01/18 10/31/17 09/22/16 07/22/15 04/20/14 04/18/13 01/22/13 12/08/12  PSA  Total PSA 2.13 ng/mL 2.29 ng/mL 1.76 ng/mL 1.39  1.88  1.71  1.88  2.35   Free PSA        0.76   % Free PSA        32     PROCEDURES:          Visit Complexity - G2211          Urinalysis w/Scope Dipstick Dipstick Cont'd Micro  Color: Amber Bilirubin: Neg mg/dL WBC/hpf: 0 - 5/hpf  Appearance: Clear Ketones: Neg mg/dL RBC/hpf: 0 - 2/hpf  Specific Gravity: 1.025  Blood: 1+ ery/uL Bacteria: Many (>50/hpf)  pH: 5.5 Protein: Trace mg/dL Cystals: NS (Not Seen)  Glucose: Neg mg/dL Urobilinogen: 0.2 mg/dL Casts: NS (Not Seen)    Nitrites: Neg Trichomonas: Not Present    Leukocyte Esterase: Trace leu/uL Mucous: Not Present      Epithelial Cells: 6 - 10/hpf      Yeast: NS (Not Seen)      Sperm: Not Present    ASSESSMENT:      ICD-10 Details  1 GU:   Flank Pain - R10.84 Right, Acute, Uncomplicated  2   Ureteral calculus - N20.1 Right, Acute, Uncomplicated   PLAN:           Orders Labs CULTURE, URINE          Document Letter(s):  Created for Patient: Clinical Summary         Notes:   His pain continues to be intermittent. I do not think reimaging is necessary. He has some right sided CVA tenderness and has not seen anything the past. Looking at his prior CT it looks like his stones were all distal but he did have 2 areas in the distal right ureter. I discussed next steps with him and advised a ureteroscopy. Urinalysis will be sent for precautionary culture today. Stone intervention was discussed in detail today. For ureteroscopy, the patient understands that there is a chance for a staged procedure. Patient also understands that there is risk  for bleeding, infection, injury to surrounding organs, and general risks of anesthesia. The patient also understands the placement of a stent and the risks of stent placement including, risk for infection, the risk for pain, and the risk for injury.         Next Appointment:      Next Appointment: 08/11/2023 09:30 AM    Appointment Type: Surgery     Location: [facility] [phone]    Provider: Norleen Seltzer, M.D.    Reason for Visit: OP--WL-CY, RIGHT RPG, URS, HLL \\T \ STENT

## 2023-08-11 ENCOUNTER — Encounter (HOSPITAL_COMMUNITY): Payer: Self-pay | Admitting: Medical

## 2023-08-11 ENCOUNTER — Encounter (HOSPITAL_COMMUNITY)
Admission: RE | Disposition: A | Discharge status: Home / Self Care | Payer: Self-pay | Source: Home / Self Care | Attending: Urology

## 2023-08-11 ENCOUNTER — Other Ambulatory Visit: Payer: Self-pay

## 2023-08-11 ENCOUNTER — Ambulatory Visit (HOSPITAL_BASED_OUTPATIENT_CLINIC_OR_DEPARTMENT_OTHER): Payer: Self-pay | Admitting: Anesthesiology

## 2023-08-11 ENCOUNTER — Ambulatory Visit (HOSPITAL_COMMUNITY)

## 2023-08-11 ENCOUNTER — Ambulatory Visit (HOSPITAL_COMMUNITY)
Admission: RE | Admit: 2023-08-11 | Discharge: 2023-08-11 | Disposition: A | Discharge status: Home / Self Care | Attending: Urology | Admitting: Urology

## 2023-08-11 ENCOUNTER — Encounter (HOSPITAL_COMMUNITY): Payer: Self-pay | Admitting: Urology

## 2023-08-11 DIAGNOSIS — G4730 Sleep apnea, unspecified: Secondary | ICD-10-CM | POA: Diagnosis not present

## 2023-08-11 DIAGNOSIS — K219 Gastro-esophageal reflux disease without esophagitis: Secondary | ICD-10-CM | POA: Diagnosis not present

## 2023-08-11 DIAGNOSIS — I2510 Atherosclerotic heart disease of native coronary artery without angina pectoris: Secondary | ICD-10-CM

## 2023-08-11 DIAGNOSIS — Z7901 Long term (current) use of anticoagulants: Secondary | ICD-10-CM | POA: Diagnosis not present

## 2023-08-11 DIAGNOSIS — E039 Hypothyroidism, unspecified: Secondary | ICD-10-CM | POA: Diagnosis not present

## 2023-08-11 DIAGNOSIS — I10 Essential (primary) hypertension: Secondary | ICD-10-CM | POA: Diagnosis not present

## 2023-08-11 DIAGNOSIS — N202 Calculus of kidney with calculus of ureter: Secondary | ICD-10-CM | POA: Diagnosis not present

## 2023-08-11 DIAGNOSIS — Z8249 Family history of ischemic heart disease and other diseases of the circulatory system: Secondary | ICD-10-CM | POA: Insufficient documentation

## 2023-08-11 DIAGNOSIS — Z79899 Other long term (current) drug therapy: Secondary | ICD-10-CM | POA: Diagnosis not present

## 2023-08-11 DIAGNOSIS — I4891 Unspecified atrial fibrillation: Secondary | ICD-10-CM | POA: Diagnosis not present

## 2023-08-11 DIAGNOSIS — N201 Calculus of ureter: Secondary | ICD-10-CM | POA: Diagnosis present

## 2023-08-11 DIAGNOSIS — Z01818 Encounter for other preprocedural examination: Secondary | ICD-10-CM

## 2023-08-11 HISTORY — PX: CYSTOSCOPY/URETEROSCOPY/HOLMIUM LASER/STENT PLACEMENT: SHX6546

## 2023-08-11 SURGERY — CYSTOSCOPY/URETEROSCOPY/HOLMIUM LASER/STENT PLACEMENT
Anesthesia: General | Laterality: Right

## 2023-08-11 MED ORDER — LACTATED RINGERS IV SOLN: INTRAVENOUS | Status: DC

## 2023-08-11 MED ORDER — OXYCODONE HCL 5 MG PO TABS: 5.0000 mg | ORAL_TABLET | Freq: Once | ORAL | Status: DC | PRN

## 2023-08-11 MED ORDER — FENTANYL CITRATE PF 50 MCG/ML IJ SOSY: 25.0000 ug | PREFILLED_SYRINGE | INTRAMUSCULAR | Status: DC | PRN

## 2023-08-11 MED ORDER — FENTANYL CITRATE (PF) 100 MCG/2ML IJ SOLN
INTRAMUSCULAR | Status: DC | PRN
  Administered 2023-08-11: 50 ug via INTRAVENOUS

## 2023-08-11 MED ORDER — MEPERIDINE HCL 25 MG/ML IJ SOLN: 6.2500 mg | INTRAMUSCULAR | Status: DC | PRN

## 2023-08-11 MED ORDER — CEFAZOLIN SODIUM-DEXTROSE 2-4 GM/100ML-% IV SOLN
2.0000 g | INTRAVENOUS | Status: AC
  Administered 2023-08-11: 2 g via INTRAVENOUS
  Filled 2023-08-11: qty 100

## 2023-08-11 MED ORDER — IOHEXOL 300 MG/ML  SOLN
INTRAMUSCULAR | Status: DC | PRN
  Administered 2023-08-11: 10 mL

## 2023-08-11 MED ORDER — ORAL CARE MOUTH RINSE: 15.0000 mL | Freq: Once | OROMUCOSAL | Status: AC

## 2023-08-11 MED ORDER — ONDANSETRON HCL 4 MG/2ML IJ SOLN
INTRAMUSCULAR | Status: AC
  Filled 2023-08-11: qty 2

## 2023-08-11 MED ORDER — HYDROMORPHONE HCL 2 MG PO TABS: 2.0000 mg | ORAL_TABLET | Freq: Four times a day (QID) | ORAL | 0 refills | Status: AC | PRN

## 2023-08-11 MED ORDER — LIDOCAINE HCL (CARDIAC) PF 100 MG/5ML IV SOSY
PREFILLED_SYRINGE | INTRAVENOUS | Status: DC | PRN
  Administered 2023-08-11: 50 mg via INTRAVENOUS

## 2023-08-11 MED ORDER — EPHEDRINE SULFATE-NACL 50-0.9 MG/10ML-% IV SOSY
PREFILLED_SYRINGE | INTRAVENOUS | Status: DC | PRN
  Administered 2023-08-11: 10 mg via INTRAVENOUS

## 2023-08-11 MED ORDER — FENTANYL CITRATE (PF) 100 MCG/2ML IJ SOLN
INTRAMUSCULAR | Status: AC
Start: 2023-08-11 — End: 2023-08-11
  Filled 2023-08-11: qty 2

## 2023-08-11 MED ORDER — PROPOFOL 10 MG/ML IV BOLUS
INTRAVENOUS | Status: DC | PRN
  Administered 2023-08-11: 150 mg via INTRAVENOUS

## 2023-08-11 MED ORDER — PROPOFOL 10 MG/ML IV BOLUS
INTRAVENOUS | Status: AC
  Filled 2023-08-11: qty 20

## 2023-08-11 MED ORDER — SODIUM CHLORIDE 0.9 % IR SOLN
Status: DC | PRN
  Administered 2023-08-11: 3000 mL via INTRAVESICAL

## 2023-08-11 MED ORDER — SODIUM CHLORIDE 0.9% FLUSH: 3.0000 mL | Freq: Two times a day (BID) | INTRAVENOUS | Status: DC

## 2023-08-11 MED ORDER — DEXAMETHASONE SODIUM PHOSPHATE 10 MG/ML IJ SOLN
INTRAMUSCULAR | Status: DC | PRN
  Administered 2023-08-11: 4 mg via INTRAVENOUS

## 2023-08-11 MED ORDER — HYDROMORPHONE HCL 2 MG PO TABS: 2.0000 mg | ORAL_TABLET | ORAL | Status: DC | PRN

## 2023-08-11 MED ORDER — DEXAMETHASONE SODIUM PHOSPHATE 10 MG/ML IJ SOLN
INTRAMUSCULAR | Status: AC
  Filled 2023-08-11: qty 1

## 2023-08-11 MED ORDER — 0.9 % SODIUM CHLORIDE (POUR BTL) OPTIME
TOPICAL | Status: DC | PRN
  Administered 2023-08-11: 1000 mL

## 2023-08-11 MED ORDER — ONDANSETRON HCL 4 MG/2ML IJ SOLN
INTRAMUSCULAR | Status: DC | PRN
  Administered 2023-08-11: 4 mg via INTRAVENOUS

## 2023-08-11 MED ORDER — ALBUMIN HUMAN 5 % IV SOLN
INTRAVENOUS | Status: AC
  Filled 2023-08-11: qty 250

## 2023-08-11 MED ORDER — LIDOCAINE HCL (PF) 2 % IJ SOLN
INTRAMUSCULAR | Status: AC
  Filled 2023-08-11: qty 5

## 2023-08-11 MED ORDER — CHLORHEXIDINE GLUCONATE 0.12 % MT SOLN
15.0000 mL | Freq: Once | OROMUCOSAL | Status: AC
  Administered 2023-08-11: 15 mL via OROMUCOSAL

## 2023-08-11 MED ORDER — ACETAMINOPHEN 500 MG PO TABS
1000.0000 mg | ORAL_TABLET | Freq: Once | ORAL | Status: AC
  Administered 2023-08-11: 1000 mg via ORAL
  Filled 2023-08-11: qty 2

## 2023-08-11 MED ORDER — ONDANSETRON HCL 4 MG/2ML IJ SOLN: 4.0000 mg | Freq: Once | INTRAMUSCULAR | Status: DC | PRN

## 2023-08-11 MED ORDER — OXYCODONE HCL 5 MG/5ML PO SOLN: 5.0000 mg | Freq: Once | ORAL | Status: DC | PRN

## 2023-08-11 SURGICAL SUPPLY — 19 items
BAG URO CATCHER STRL LF (MISCELLANEOUS) ×1 IMPLANT
BASKET STONE NCOMPASS (UROLOGICAL SUPPLIES) IMPLANT
CATH URETERAL DUAL LUMEN 10F (MISCELLANEOUS) IMPLANT
CATH URETL OPEN 5X70 (CATHETERS) IMPLANT
CLOTH BEACON ORANGE TIMEOUT ST (SAFETY) ×1 IMPLANT
EXTRACTOR STONE NITINOL NGAGE (UROLOGICAL SUPPLIES) IMPLANT
GLOVE SURG SS PI 8.0 STRL IVOR (GLOVE) ×1 IMPLANT
GOWN STRL SURGICAL XL XLNG (GOWN DISPOSABLE) ×1 IMPLANT
GUIDEWIRE STR DUAL SENSOR (WIRE) ×1 IMPLANT
KIT TURNOVER KIT A (KITS) ×1 IMPLANT
LASER FIB FLEXIVA PULSE ID 365 (Laser) ×1 IMPLANT
MANIFOLD NEPTUNE II (INSTRUMENTS) ×1 IMPLANT
PACK CYSTO (CUSTOM PROCEDURE TRAY) ×1 IMPLANT
SHEATH NAV HD 11/13X46 (SHEATH) IMPLANT
SHEATH NAVIGATOR HD 11/13X36 (SHEATH) IMPLANT
STENT URET 6FRX26 CONTOUR (STENTS) IMPLANT
TRACTIP FLEXIVA PULS ID 200XHI (Laser) IMPLANT
TUBING CONNECTING 10 (TUBING) ×1 IMPLANT
TUBING UROLOGY SET (TUBING) ×1 IMPLANT

## 2023-08-11 NOTE — Discharge Instructions (Addendum)
 You may remove the stent by pulling the string on Monday morning.  If you don't feel you can do that, please call the office to have it removed.  Please bring the stone fragments to the office appointment.

## 2023-08-11 NOTE — Transfer of Care (Signed)
 Immediate Anesthesia Transfer of Care Note  Patient: Miguel Morgan  Procedure(s) Performed: CYSTOSCOPY/URETEROSCOPY/HOLMIUM LASER/STENT PLACEMENT (Right)  Patient Location: PACU  Anesthesia Type:General  Level of Consciousness: drowsy and patient cooperative  Airway & Oxygen  Therapy: Patient Spontanous Breathing and Patient connected to face mask oxygen   Post-op Assessment: Report given to RN and Post -op Vital signs reviewed and stable  Post vital signs: Reviewed and stable  Last Vitals:  Vitals Value Taken Time  BP 163/84 08/11/23 11:03  Temp    Pulse 64 08/11/23 11:06  Resp 11 08/11/23 11:06  SpO2 100 % 08/11/23 11:06  Vitals shown include unfiled device data.  Last Pain:  Vitals:   08/11/23 0814  TempSrc:   PainSc: 0-No pain         Complications: No notable events documented.

## 2023-08-11 NOTE — Anesthesia Procedure Notes (Signed)
 Procedure Name: LMA Insertion Date/Time: 08/11/2023 9:48 AM  Performed by: Metta Andrea NOVAK, CRNAPre-anesthesia Checklist: Patient identified, Emergency Drugs available, Suction available, Patient being monitored and Timeout performed Patient Re-evaluated:Patient Re-evaluated prior to induction Oxygen  Delivery Method: Circle system utilized Preoxygenation: Pre-oxygenation with 100% oxygen  Induction Type: IV induction Ventilation: Mask ventilation without difficulty LMA: LMA inserted LMA Size: 5.0 Number of attempts: 1 Placement Confirmation: positive ETCO2 Tube secured with: Tape Dental Injury: Teeth and Oropharynx as per pre-operative assessment

## 2023-08-11 NOTE — Anesthesia Postprocedure Evaluation (Signed)
 Anesthesia Post Note  Patient: Carlin DELENA Gent  Procedure(s) Performed: CYSTOSCOPY/URETEROSCOPY/HOLMIUM LASER/STENT PLACEMENT (Right)     Patient location during evaluation: PACU Anesthesia Type: General Level of consciousness: awake and alert Pain management: pain level controlled Vital Signs Assessment: post-procedure vital signs reviewed and stable Respiratory status: spontaneous breathing, nonlabored ventilation, respiratory function stable and patient connected to nasal cannula oxygen  Cardiovascular status: blood pressure returned to baseline and stable Postop Assessment: no apparent nausea or vomiting Anesthetic complications: no   No notable events documented.  Last Vitals:  Vitals:   08/11/23 1230 08/11/23 1245  BP: 139/73 137/77  Pulse: 66 66  Resp:    Temp:    SpO2: 96% 97%    Last Pain:  Vitals:   08/11/23 1245  TempSrc:   PainSc: 0-No pain                 Luvern Mischke

## 2023-08-11 NOTE — Op Note (Signed)
 Procedure: 1.  Cystoscopy with right retrograde pyelogram and interpretation. 2.  Right ureteroscopy with holmium laser application, stone extraction and insertion of right double-J stent. 3.  Application of fluoroscopy.  Preop diagnosis: Right ureteral and renal stones.  Postop diagnosis: Right renal stones with interval passage of right ureteral stones into the bladder.  Surgeon: Dr. Norleen Seltzer.  Anesthesia: General.  Specimen: Stone fragments.  Drains: 6 French by 26 cm right Contour double-J stent with tether.  EBL: None.  Complications: None.  Indications: The patient is an 83 year old male with a history of stones who was recently had right flank pain with the right ureteral stones on recent imaging.  He also has a large 17 mm right upper pole stone and smaller right renal stones.  With his persistent pain he is elected undergo ureteroscopy for management.  Procedure: He was taken the operating room where general anesthetic was induced.  He was given Ancef .  He was placed in lithotomy position and food PSIs.  His perineum and genitalia were prepped with Betadine solution he was draped in usual sterile fashion.  Cystoscopy was performed using a 21 [place] scope and 30 degree lens.  Examination fairly normal urethra.  The external sphincter was intact.  The prostatic urethra was approximately 3 cm in length with some lateral lobe hyperplasia with mild obstruction.  There was a small middle lobe.  Examination of bladder revealed mild trabeculation without tumor or inflammation but there were a few small yellowish stones in the bladder consistent with uric acid stones.  Ureteral orifices were unremarkable.  The right ureteral orifice was cannulated with a 5 [place] open-ended catheter and Omnipaque  was instilled.  Right retrograde pyelogram demonstrated a normal ureter with without filling defects however there was a large filling defect in the upper pole of the kidney consistent with his  known stone.  A sensor wire was passed to the kidney under fluoroscopic guidance and the inner core of a 46 cm 11/13 [place] digital access sheath was passed into the proximal ureter without difficulty.  This was followed by the assembled sheath.  The inner core and wire were then removed.  The dual-lumen digital flexible scope was then passed to the kidney and the collecting system was inspected.  The only significant stone identified was the large stone in the upper pole as expected.  I did not see smaller stones in the lower and mid calyces suggesting these that either passed or were submucosal.  Once the stone was identified it was fragmented using the 242 micron holmium laser fiber with the Moses laser on the dusting setting with 0.3 J and 63 Hz on the left pedal and 0.8 J and 10 Hz on the right pedal.  The bulk of the fragmentation was done using the low power high-frequency setting.  The stone readily fragmented into manageable fragments.  The larger fragments were then removed using an engage basket.  Once final inspection revealed no significant fragments larger than 2 mm, the ureteroscope was removed over a wire along with the sheath and the ureter was inspected as ureteroscope was that drawn.  No ureteral trauma or intraureteral stones were identified.  The cystoscope was then reinserted over the wire and a 6 [place] by 26 cm contour double-J stent was passed the kidney under fluoroscopic guidance.  The wire was removed, a good coil in the kidney and a good coil in the bladder.  The bladder was drained and the cystoscope was removed leaving the stent string  exiting the urethra.  The string was secured to the patient's penis.  He was taken down from lithotomy position, his anesthetic was reversed and he was moved to recovery in stable condition.  There were no complications.  The stone fragments were given to the family.

## 2023-08-11 NOTE — Interval H&P Note (Signed)
 History and Physical Interval Note:  He still has some pain.   08/11/2023 8:13 AM  [facility]  has presented today for surgery, with the diagnosis of RIGHT URETERAL STONE.  The various methods of treatment have been discussed with the patient and family. After consideration of risks, benefits and other options for treatment, the patient has consented to  Procedure(s): CYSTOSCOPY/URETEROSCOPY/HOLMIUM LASER/STENT PLACEMENT (Right) as a surgical intervention.  The patient's history has been reviewed, patient examined, no change in status, stable for surgery.  I have reviewed the patient's chart and labs.  Questions were answered to the patient's satisfaction.     Saud Bail

## 2023-08-12 ENCOUNTER — Encounter (HOSPITAL_COMMUNITY): Payer: Self-pay | Admitting: Urology

## 2023-08-15 DIAGNOSIS — N200 Calculus of kidney: Secondary | ICD-10-CM | POA: Diagnosis not present

## 2023-08-25 NOTE — Progress Notes (Signed)
 Cardiology Office Note:    Date:  08/25/2023   ID:  Miguel Morgan, DOB 02/09/1940, MRN [id number]  PCP:  Fernand Tracey DELENA, MD  Cardiologist:  Redell Leiter, MD    Referring MD: Fernand Tracey DELENA, MD    ASSESSMENT:    1. Paroxysmal atrial fibrillation (HCC)   2. Chronic anticoagulation   3. 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 Mixed hyperlipidemia   5. Primary hypertension    PLAN:    In order of problems listed above:  Stable maintaining sinus rhythm continue his anticoagulant and beta-blocker Stable CAD no anginal discomfort continue medical therapy including statin beta-blocker Continue his current statin lipid profile March cholesterol 140 LDL 72 Controlled we will continue his ARB   Next appointment: 6 months   Medication Adjustments/Labs and Tests Ordered: Current medicines are reviewed at length with the patient today.  Concerns regarding medicines are outlined above.  No orders of the defined types were placed in this encounter.  No orders of the defined types were placed in this encounter.    History of Present Illness:    Miguel Morgan is a 83 y.o. male with a hx of paroxysmal atrial fibrillation maintaining sinus rhythm without an antiarrhythmic drug and with chronic anticoagulation mild nonobstructive CAD hypertension and hyperlipidemia last seen here 03/03/2023.  He has echocardiogram in April 2023 showed normal left ventricular size wall thickness systolic function EF 60 to 65% and no valvular abnormality.  Compliance with diet, lifestyle and medications: Yes  He is having no cardiovascular plaints of edema shortness of breath chest pain palpitation or syncope.  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   Arthritis of  right wrist 11/18/2021   Carpal tunnel syndrome 11/18/2021   Colon polyps    Coronary artery disease    nonobstructive CAD   Diverticulitis    Dysfunction of right eustachian tube 08/16/2022   Dysrhythmia    GERD (gastroesophageal reflux disease)    History of kidney stones    Hyperlipidemia    Hypertension    Hypothyroid 06/29/2011   IBS (irritable bowel syndrome)    Kidney stones    Lumbar spinal stenosis    Lumbar spondylosis 02/21/2014   Mild depression    Pernicious anemia    Pneumonia    Primary osteoarthritis of left knee 06/04/2015   Rupture of flexor tendon of finger 11/18/2021   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      EKGs/Labs/Other Studies Reviewed:    The following studies were reviewed today:  Cardiac Studies & Procedures   ______________________________________________________________________________________________   STRESS TESTS  NM MYOCAR MULTI W/SPECT W 08/29/2000   ECHOCARDIOGRAM  ECHOCARDIOGRAM COMPLETE 04/15/2021  Narrative ECHOCARDIOGRAM REPORT    Patient Name:   Miguel Morgan Date of Exam: 04/15/2021 Medical Rec #:  [id number]          Height:       71.0 in Accession #:  [id number]         Weight:       219.0 lb Date of Birth:  August 28, 1940          BSA:          2.191 m Patient Age:    46 years           BP:           158/80 mmHg Patient Gender: M                  HR:           57 bpm. 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 CAD; Risk Factors:Dyslipidemia and Hypertension.  Sonographer:    Charlie Jointer RDCS Referring Phys: [id number] Megahn Killings J Mackensi Mahadeo  IMPRESSIONS   1. GLS -15.6. Left ventricular ejection fraction, by estimation, is 60 to 65%. The left ventricle has normal function. The left ventricle has no regional wall motion abnormalities. Left ventricular diastolic parameters are consistent with Grade II diastolic dysfunction (pseudonormalization). 2. Right ventricular systolic function is normal. The right ventricular size is normal. 3. The mitral valve is normal in structure. No evidence of mitral valve regurgitation. No evidence of mitral stenosis. 4. The aortic valve is normal in structure. Aortic valve regurgitation is not visualized. Aortic valve sclerosis/calcification is present, without any evidence of aortic stenosis. 5. The inferior vena cava is normal in size with greater than 50% respiratory variability, suggesting right atrial pressure of 3 mmHg.  FINDINGS Left Ventricle: GLS -15.6. Left ventricular ejection fraction, by estimation, is 60 to 65%. The left ventricle has normal function. The left ventricle has no regional wall motion abnormalities. The left ventricular internal cavity size was normal in size. There is no left ventricular hypertrophy. Left ventricular diastolic parameters are consistent with Grade II diastolic dysfunction (pseudonormalization).  Right Ventricle: The right ventricular size is normal. No increase in right ventricular wall thickness. Right ventricular systolic function is normal.  Left Atrium: Left atrial size was normal in size.  Right Atrium: Right atrial size was normal in size.  Pericardium: There is no evidence of pericardial effusion.  Mitral Valve: The mitral valve is normal in structure.  No evidence of mitral valve regurgitation. No evidence of mitral valve stenosis.  Tricuspid Valve: The tricuspid valve is normal in structure. Tricuspid valve regurgitation is not demonstrated. No evidence of tricuspid stenosis.  Aortic Valve: The aortic valve is normal in structure. Aortic valve regurgitation is not visualized. Aortic valve sclerosis/calcification is present, without any evidence of aortic stenosis.  Pulmonic Valve: The pulmonic valve was normal in structure. Pulmonic valve regurgitation is not visualized. No evidence of pulmonic stenosis.  Aorta: The aortic root is normal in size and structure.  Venous: The inferior vena cava is normal in size with greater than 50% respiratory variability, suggesting right atrial pressure of 3 mmHg.  IAS/Shunts: No atrial level shunt detected by color flow Doppler.   LEFT VENTRICLE PLAX 2D LVIDd:         5.30 cm   Diastology LVIDs:         3.50 cm   LV e' medial:    6.85 cm/s LV  PW:         1.00 cm   LV E/e' medial:  14.5 LV IVS:        1.00 cm   LV e' lateral:   11.50 cm/s LVOT diam:     2.20 cm   LV E/e' lateral: 8.7 LV SV:         116 LV SV Index:   53 LVOT Area:     3.80 cm   RIGHT VENTRICLE             IVC RV Basal diam:  2.80 cm     IVC diam: 2.30 cm RV S prime:     13.60 cm/s TAPSE (M-mode): 3.0 cm  LEFT ATRIUM           Index        RIGHT ATRIUM           Index LA diam:      3.10 cm 1.41 cm/m   RA Area:     11.40 cm LA Vol (A4C): 31.6 ml 14.42 ml/m  RA Volume:   23.40 ml  10.68 ml/m AORTIC VALVE LVOT Vmax:   117.00 cm/s LVOT Vmean:  88.100 cm/s LVOT VTI:    0.305 m  AORTA Ao Root diam: 3.20 cm Ao Asc diam:  3.60 cm  MITRAL VALVE MV Area (PHT): 2.62 cm    SHUNTS MV Decel Time: 290 msec    Systemic VTI:  0.30 m MV E velocity: 99.50 cm/s  Systemic Diam: 2.20 cm MV A velocity: 85.90 cm/s MV E/A ratio:  1.16  Lamar Fitch MD Electronically signed by Lamar Fitch MD Signature Date/Time:  04/15/2021/5:17:43 PM    Final    MONITORS  CARDIAC EVENT MONITOR 03/15/2021       ______________________________________________________________________________________________             Recent Labs: 08/08/2023: BUN 18; Creatinine, Ser 0.76; Hemoglobin 12.0; Platelets 169; Potassium 4.4; Sodium 140  Recent Lipid Panel    Component Value Date/Time   CHOL 163 11/23/2021 1355   TRIG 265 (H) 11/23/2021 1355   HDL 48 11/23/2021 1355   CHOLHDL 3.4 11/23/2021 1355   LDLCALC 72 11/23/2021 1355    Physical Exam:    VS:  There were no vitals taken for this visit.    Wt Readings from Last 3 Encounters:  08/11/23 190 lb (86.2 kg)  08/08/23 190 lb (86.2 kg)  03/03/23 222 lb 12.8 oz (101.1 kg)     GEN:  Well nourished, well developed in no acute distress HEENT: Normal NECK: No JVD; No carotid bruits LYMPHATICS: No lymphadenopathy CARDIAC: RRR, no murmurs, rubs, gallops RESPIRATORY:  Clear to auscultation without rales, wheezing or rhonchi  ABDOMEN: Soft, non-tender, non-distended MUSCULOSKELETAL:  No edema; No deformity  SKIN: Warm and dry NEUROLOGIC:  Alert and oriented x 3 PSYCHIATRIC:  Normal affect    Signed, Redell Leiter, MD  08/25/2023 8:23 PM    [facility]

## 2023-08-27 DIAGNOSIS — G4733 Obstructive sleep apnea (adult) (pediatric): Secondary | ICD-10-CM | POA: Diagnosis not present

## 2023-08-29 ENCOUNTER — Other Ambulatory Visit: Payer: Self-pay

## 2023-08-30 ENCOUNTER — Encounter: Payer: Self-pay | Admitting: Cardiology

## 2023-08-30 ENCOUNTER — Ambulatory Visit: Attending: Cardiology | Admitting: Cardiology

## 2023-08-30 VITALS — BP 130/62 | HR 58 | Ht 71.0 in | Wt 190.8 lb

## 2023-08-30 DIAGNOSIS — E782 Mixed hyperlipidemia: Secondary | ICD-10-CM

## 2023-08-30 DIAGNOSIS — I480 Paroxysmal atrial fibrillation: Secondary | ICD-10-CM

## 2023-08-30 DIAGNOSIS — I25118 Atherosclerotic heart disease of native coronary artery with other forms of angina pectoris: Secondary | ICD-10-CM | POA: Diagnosis not present

## 2023-08-30 DIAGNOSIS — Z7901 Long term (current) use of anticoagulants: Secondary | ICD-10-CM | POA: Diagnosis not present

## 2023-08-30 DIAGNOSIS — I10 Essential (primary) hypertension: Secondary | ICD-10-CM | POA: Diagnosis not present

## 2023-08-30 NOTE — Patient Instructions (Signed)
 Medication Instructions:  Your physician recommends that you continue on your current medications as directed. Please refer to the Current Medication list given to you today.  *If you need a refill on your cardiac medications before your next appointment, please call your pharmacy*  Lab Work: None If you have labs (blood work) drawn today and your tests are completely normal, you will receive your results only by: MyChart Message (if you have MyChart) OR A paper copy in the mail If you have any lab test that is abnormal or we need to change your treatment, we will call you to review the results.  Testing/Procedures: None  Follow-Up: At [facility], you and your health needs are our priority.  As part of our continuing mission to provide you with exceptional heart care, our providers are all part of one team.  This team includes your primary Cardiologist (physician) and Advanced Practice Providers or APPs (Physician Assistants and Nurse Practitioners) who all work together to provide you with the care you need, when you need it.  Your next appointment:   6 month(s)  Provider:   Redell Leiter, MD    We recommend signing up for the patient portal called MyChart.  Sign up information is provided on this After Visit Summary.  MyChart is used to connect with patients for Virtual Visits (Telemedicine).  Patients are able to view lab/test results, encounter notes, upcoming appointments, etc.  Non-urgent messages can be sent to your provider as well.   To learn more about what you can do with MyChart, go to [email or web address].   Other Instructions Take a mutivitamin daily

## 2023-08-31 ENCOUNTER — Other Ambulatory Visit: Payer: Self-pay | Admitting: Cardiology

## 2023-09-08 DIAGNOSIS — N200 Calculus of kidney: Secondary | ICD-10-CM | POA: Diagnosis not present

## 2023-09-14 DIAGNOSIS — M5416 Radiculopathy, lumbar region: Secondary | ICD-10-CM | POA: Diagnosis not present

## 2023-10-03 DIAGNOSIS — N200 Calculus of kidney: Secondary | ICD-10-CM | POA: Diagnosis not present

## 2023-10-03 DIAGNOSIS — N281 Cyst of kidney, acquired: Secondary | ICD-10-CM | POA: Diagnosis not present

## 2023-10-11 ENCOUNTER — Other Ambulatory Visit: Payer: Self-pay | Admitting: Cardiology

## 2023-10-11 NOTE — Telephone Encounter (Signed)
 Prescription refill request for Eliquis  received. Indication:afib Last office visit:8/25 Scr:0.76  8/25 Age: 83 Weight:86.5  kg  Prescription refilled

## 2023-10-20 DIAGNOSIS — N200 Calculus of kidney: Secondary | ICD-10-CM | POA: Diagnosis not present

## 2023-12-15 DIAGNOSIS — M5416 Radiculopathy, lumbar region: Secondary | ICD-10-CM | POA: Diagnosis not present

## 2024-01-11 NOTE — Progress Notes (Signed)
 Miguel Morgan                                          MRN: [id number]   01/11/2024   The [facility] Quality Team Specialist reviewed this patient medical record for the purposes of chart review for care gap closure. The following were reviewed: chart review for care gap closure-kidney health evaluation for diabetes:eGFR  and uACR.    [facility]I Quality Team

## 2024-03-02 ENCOUNTER — Ambulatory Visit: Admitting: Cardiology
